# Patient Record
Sex: Female | Born: 1975 | Race: Black or African American | Hispanic: No | Smoking: Never smoker
Health system: Southern US, Community
[De-identification: ages and names within clinical notes are randomized; demographics above are authoritative.]

## PROBLEM LIST (undated history)

## (undated) DIAGNOSIS — G932 Benign intracranial hypertension: Secondary | ICD-10-CM

## (undated) HISTORY — PX: ABDOMINAL HYSTERECTOMY: SHX81

## (undated) HISTORY — PX: TUBAL LIGATION: SHX77

---

## 2008-08-17 ENCOUNTER — Emergency Department (HOSPITAL_COMMUNITY): Admission: EM | Admit: 2008-08-17 | Discharge: 2008-08-18 | Payer: Self-pay | Admitting: Emergency Medicine

## 2008-08-21 ENCOUNTER — Ambulatory Visit: Payer: Self-pay | Admitting: Internal Medicine

## 2008-08-21 DIAGNOSIS — R079 Chest pain, unspecified: Secondary | ICD-10-CM

## 2008-08-21 DIAGNOSIS — K219 Gastro-esophageal reflux disease without esophagitis: Secondary | ICD-10-CM | POA: Insufficient documentation

## 2008-10-07 ENCOUNTER — Ambulatory Visit: Payer: Self-pay | Admitting: Internal Medicine

## 2008-10-08 ENCOUNTER — Ambulatory Visit: Payer: Self-pay | Admitting: Internal Medicine

## 2009-01-20 ENCOUNTER — Emergency Department (HOSPITAL_COMMUNITY): Admission: EM | Admit: 2009-01-20 | Discharge: 2009-01-21 | Payer: Self-pay | Admitting: Family Medicine

## 2009-03-25 ENCOUNTER — Emergency Department (HOSPITAL_COMMUNITY): Admission: EM | Admit: 2009-03-25 | Discharge: 2009-03-25 | Payer: Self-pay | Admitting: Family Medicine

## 2009-07-04 ENCOUNTER — Emergency Department (HOSPITAL_COMMUNITY): Admission: EM | Admit: 2009-07-04 | Discharge: 2009-07-04 | Payer: Self-pay | Admitting: Emergency Medicine

## 2009-11-27 ENCOUNTER — Emergency Department (HOSPITAL_COMMUNITY): Admission: EM | Admit: 2009-11-27 | Discharge: 2009-11-27 | Payer: Self-pay | Admitting: Emergency Medicine

## 2010-03-08 ENCOUNTER — Emergency Department (HOSPITAL_COMMUNITY): Admission: EM | Admit: 2010-03-08 | Discharge: 2010-03-08 | Payer: Self-pay | Admitting: Emergency Medicine

## 2010-04-14 ENCOUNTER — Emergency Department (HOSPITAL_COMMUNITY): Admission: EM | Admit: 2010-04-14 | Discharge: 2010-04-14 | Payer: Self-pay | Admitting: Emergency Medicine

## 2010-07-10 ENCOUNTER — Emergency Department (HOSPITAL_COMMUNITY): Admission: EM | Admit: 2010-07-10 | Discharge: 2010-07-10 | Payer: Self-pay | Admitting: Emergency Medicine

## 2010-09-03 ENCOUNTER — Other Ambulatory Visit: Admission: RE | Admit: 2010-09-03 | Discharge: 2010-09-03 | Payer: Self-pay | Admitting: Family Medicine

## 2011-03-06 LAB — URINALYSIS, ROUTINE W REFLEX MICROSCOPIC
Glucose, UA: NEGATIVE mg/dL
Hgb urine dipstick: NEGATIVE
Ketones, ur: NEGATIVE mg/dL
Leukocytes, UA: NEGATIVE
pH: 6.5 (ref 5.0–8.0)

## 2011-03-06 LAB — URINE MICROSCOPIC-ADD ON

## 2011-03-23 LAB — POCT URINALYSIS DIP (DEVICE)
Hgb urine dipstick: NEGATIVE
Protein, ur: 30 mg/dL — AB
Specific Gravity, Urine: 1.02 (ref 1.005–1.030)
Urobilinogen, UA: 0.2 mg/dL (ref 0.0–1.0)

## 2011-03-23 LAB — POCT PREGNANCY, URINE

## 2011-03-23 LAB — WET PREP, GENITAL: Clue Cells Wet Prep HPF POC: NONE SEEN

## 2011-03-23 LAB — GC/CHLAMYDIA PROBE AMP, GENITAL

## 2011-03-29 LAB — URINALYSIS, ROUTINE W REFLEX MICROSCOPIC
Hgb urine dipstick: NEGATIVE
Protein, ur: NEGATIVE mg/dL
Urobilinogen, UA: 0.2 mg/dL (ref 0.0–1.0)

## 2011-03-29 LAB — POCT I-STAT, CHEM 8
Calcium, Ion: 1.18 mmol/L (ref 1.12–1.32)
Creatinine, Ser: 0.8 mg/dL (ref 0.4–1.2)
Hemoglobin: 13.3 g/dL (ref 12.0–15.0)
Sodium: 142 mEq/L (ref 135–145)
TCO2: 26 mmol/L (ref 0–100)

## 2011-03-29 LAB — HEPATIC FUNCTION PANEL
ALT: 22 U/L (ref 0–35)
Albumin: 3.6 g/dL (ref 3.5–5.2)
Alkaline Phosphatase: 61 U/L (ref 39–117)
Total Protein: 7.4 g/dL (ref 6.0–8.3)

## 2011-03-29 LAB — DIFFERENTIAL
Lymphocytes Relative: 42 % (ref 12–46)
Lymphs Abs: 3.5 10*3/uL (ref 0.7–4.0)
Monocytes Relative: 7 % (ref 3–12)
Neutro Abs: 3.9 10*3/uL (ref 1.7–7.7)
Neutrophils Relative %: 48 % (ref 43–77)

## 2011-03-29 LAB — CBC
RBC: 3.96 MIL/uL (ref 3.87–5.11)
WBC: 8.2 10*3/uL (ref 4.0–10.5)

## 2011-03-29 LAB — ELECTROLYTE PANEL
CO2: 28 mEq/L (ref 19–32)
Chloride: 108 mEq/L (ref 96–112)

## 2011-09-14 LAB — POCT I-STAT, CHEM 8
BUN: 8
Calcium, Ion: 1.1 — ABNORMAL LOW
Chloride: 107
Creatinine, Ser: 1.1
Glucose, Bld: 102 — ABNORMAL HIGH
HCT: 38
Hemoglobin: 12.9
Potassium: 3.9
Sodium: 138
TCO2: 21

## 2011-09-14 LAB — CBC
HCT: 37.4
Hemoglobin: 12.6
MCHC: 33.8
MCV: 90.3
Platelets: 275
RBC: 4.14
RDW: 13.3
WBC: 8.2

## 2011-09-14 LAB — DIFFERENTIAL
Basophils Absolute: 0
Basophils Relative: 0
Eosinophils Absolute: 0.3
Eosinophils Relative: 3
Lymphocytes Relative: 50 — ABNORMAL HIGH
Lymphs Abs: 4
Monocytes Absolute: 0.6
Monocytes Relative: 8
Neutro Abs: 3.2
Neutrophils Relative %: 39 — ABNORMAL LOW

## 2011-09-14 LAB — POCT CARDIAC MARKERS
CKMB, poc: <1 — ABNORMAL LOW
Myoglobin, poc: 53.8
Troponin i, poc: <0.05

## 2011-09-14 LAB — D-DIMER, QUANTITATIVE: D-Dimer, Quant: 0.95 — ABNORMAL HIGH

## 2011-11-30 ENCOUNTER — Ambulatory Visit: Payer: Self-pay

## 2012-03-27 ENCOUNTER — Inpatient Hospital Stay (HOSPITAL_COMMUNITY): Payer: 59

## 2012-03-27 ENCOUNTER — Inpatient Hospital Stay (HOSPITAL_COMMUNITY)
Admission: AD | Admit: 2012-03-27 | Discharge: 2012-03-28 | Disposition: A | Discharge status: Home / Self Care | Payer: 59 | Source: Ambulatory Visit | Attending: Obstetrics and Gynecology | Admitting: Obstetrics and Gynecology

## 2012-03-27 ENCOUNTER — Encounter (HOSPITAL_COMMUNITY): Payer: Self-pay | Admitting: *Deleted

## 2012-03-27 DIAGNOSIS — R109 Unspecified abdominal pain: Secondary | ICD-10-CM

## 2012-03-27 DIAGNOSIS — N83202 Unspecified ovarian cyst, left side: Secondary | ICD-10-CM

## 2012-03-27 DIAGNOSIS — N83209 Unspecified ovarian cyst, unspecified side: Secondary | ICD-10-CM

## 2012-03-27 LAB — COMPREHENSIVE METABOLIC PANEL
AST: 13 U/L (ref 0–37)
Albumin: 3.7 g/dL (ref 3.5–5.2)
Alkaline Phosphatase: 54 U/L (ref 39–117)
Chloride: 103 mEq/L (ref 96–112)
Potassium: 3.7 mEq/L (ref 3.5–5.1)
Total Bilirubin: 0.1 mg/dL — ABNORMAL LOW (ref 0.3–1.2)

## 2012-03-27 LAB — CBC
Platelets: 283 10*3/uL (ref 150–400)
RDW: 13.6 % (ref 11.5–15.5)
WBC: 8.9 10*3/uL (ref 4.0–10.5)

## 2012-03-27 LAB — URINE MICROSCOPIC-ADD ON

## 2012-03-27 LAB — URINALYSIS, ROUTINE W REFLEX MICROSCOPIC
Leukocytes, UA: NEGATIVE
Nitrite: NEGATIVE
Specific Gravity, Urine: 1.02 (ref 1.005–1.030)
pH: 6.5 (ref 5.0–8.0)

## 2012-03-27 MED ORDER — LACTATED RINGERS IV BOLUS (SEPSIS): 250.0000 mL | Freq: Once | INTRAVENOUS | Status: DC

## 2012-03-27 MED ORDER — SODIUM CHLORIDE 0.9 % IV SOLN: 4.0000 mg | Freq: Once | INTRAVENOUS | Status: DC

## 2012-03-27 MED ORDER — LACTATED RINGERS IV SOLN
Freq: Once | INTRAVENOUS | Status: AC
  Administered 2012-03-27: via INTRAVENOUS

## 2012-03-27 MED ORDER — KETOROLAC TROMETHAMINE 60 MG/2ML IM SOLN: 60.0000 mg | Freq: Once | INTRAMUSCULAR | Status: DC

## 2012-03-27 MED ORDER — HYDROMORPHONE HCL PF 1 MG/ML IJ SOLN
1.0000 mg | Freq: Once | INTRAMUSCULAR | Status: AC
  Administered 2012-03-27: 1 mg via INTRAVENOUS
  Filled 2012-03-27: qty 1

## 2012-03-27 NOTE — MAU Note (Signed)
Iv attempt x 3 without success, CRNA called to assess.

## 2012-03-27 NOTE — MAU Note (Signed)
Pt presents with complaint of onset of severe lower abd pain 1 hour ago. States pain goes all the way across the lower abd and lower back. Has sharp pain in vaginal area. History of partial hyst in 2008 for adenomyosis. Reports nausea with pain.

## 2012-03-27 NOTE — MAU Note (Signed)
[place],NP at bedside, CRNA at bedside to attempt IV

## 2012-03-27 NOTE — MAU Provider Note (Signed)
History     CSN: [id number]  Arrival date and time: 03/27/12 2051   First Provider Initiated Contact with Patient 03/27/12 2157      Chief Complaint  Patient presents with  . Abdominal Pain   HPI Annette Garrett 36 y.o. Comes to MAU with severe abdominal pain which started at 8 pm tonight.  Client lying on her side and on initial exam, can barely talk and cannot move to her back for evaluation.  History of partial hysterectomy in 2008.  OB History    Grav Para Term Preterm Abortions TAB SAB Ect Mult Living   5 3   2  2   3       Past Medical History  Diagnosis Date  . Asthma     Past Surgical History  Procedure Date  . Abdominal hysterectomy   . Tubal ligation     Family History  Problem Relation Age of Onset  . Hypertension Mother   . Diabetes Father   . Diabetes Maternal Grandmother   . Hypertension Maternal Grandmother     History  Substance Use Topics  . Smoking status: Never Smoker   . Smokeless tobacco: Not on file  . Alcohol Use: No    Allergies:  Allergies  Allergen Reactions  . Oxycodone-Acetaminophen Itching    Prescriptions prior to admission  Medication Sig Dispense Refill  . albuterol (PROVENTIL) (2.5 MG/3ML) 0.083% nebulizer solution Take 2.5 mg by nebulization every 6 (six) hours as needed. For shortness of breath        Review of Systems  Gastrointestinal: Positive for nausea and abdominal pain. Negative for vomiting and diarrhea.  Genitourinary: Negative for dysuria.   Physical Exam   Blood pressure 109/88, pulse 85, temperature 97.4 F (36.3 C), temperature source Oral, resp. rate 20.  Physical Exam  Nursing note and vitals reviewed. Constitutional: She is oriented to person, place, and time. She appears well-developed and well-nourished.       Obese  HENT:  Head: Normocephalic.  Eyes: EOM are normal.  Neck: Neck supple.  GI: Soft. There is tenderness. There is no rebound.       Mild guarding, pain all across lower  abdomen just below the umbilicus Bowel sounds hyperactive in all quadrants.  Musculoskeletal: Normal range of motion.  Neurological: She is alert and oriented to person, place, and time.  Skin: Skin is warm and dry.  Psychiatric: She has a normal mood and affect.    MAU Course  Procedures Blood drawn in left antecubital space.  Client reports "lump" in arm near draw site.  No bruising noted.  No bleeding from site.  Area 1.5 cm in diameter palpated.  Mild tenderness.  Reassured client.  Likely is some blood under the skin and may notice bruising later.  Ice pack applied to site.  Client is difficult IV stick.  Planned to give IVF and Dilaudid, however unable to start IVF.  Anesthesia is delayed in coming to stick client.  Client currently is talking and moving much better in bed, however she reports pain at 10/10.  Advised to take Toradol 60 mg IM and have ultrasound done.  Will see how well ultrasound is tolerated and give narcotic if needed. 2318  Client sitting and rocking in bed.  CRNA here to start IVF.  Got IVF started and had Dilaudid 1 mg and Zofran 4 mg IVpush.  MDM Clinical Data: Severe abdominal pain.  ABDOMINAL ULTRASOUND COMPLETE  Comparison: None.  Findings:  Technically good cold  study due to body habitus.  Gallbladder: No gallstones, gallbladder wall thickening, or  pericholecystic fluid. Sonographic Murphy's sign is negative.  Common Bile Duct: Within normal limits in caliber.  Liver: No focal mass lesion identified. Within normal limits in  parenchymal echogenicity.  IVC: Appears normal.  Pancreas: No abnormality identified.  Spleen: Within normal limits in size and echotexture.  Right kidney: Normal in size and parenchymal echogenicity. No  evidence of mass or hydronephrosis.  Left kidney: Normal in size and parenchymal echogenicity. No  evidence of mass or hydronephrosis.  Abdominal Aorta: No aneurysm identified.  IMPRESSION:  Negative abdominal  ultrasound  Clinical Data: Severe abdominal pain  TRANSABDOMINAL AND TRANSVAGINAL ULTRASOUND OF PELVIS  Technique: Both transabdominal and transvaginal ultrasound  examinations of the pelvis were performed. Transabdominal technique  was performed for global imaging of the pelvis including uterus,  ovaries, adnexal regions, and pelvic cul-de-sac.  Comparison: Abdominal ultrasound 03/28/2012  It was necessary to proceed with endovaginal exam following the  transabdominal exam to visualize the ovaries and adnexa.  Findings:  Uterus: Surgically absent.  Endometrium: Surgically absent.  Right ovary: Measures 2.5 x 1.5 x 1.5 cm and has normal  appearances. No right adnexal mass is seen.  Left ovary: Measures 4.5 x 3.8-3.9 cm and contains a mildly complex  cyst with some internal fine linear echogenicities. This cyst  measures 3.7 x 3.0 x 3.3 cm.  Other findings: A trace amount of free pelvic fluid is seen.  In the midline pelvis is an oblong heterogeneous predominately  echogenic area, appears to conform to the peritoneal space, with  surrounding fluid. This measures approximately 6 x 1.5 x 5.0 cm.  Question if this could reflect some complex fluid within the  pelvis.  IMPRESSION:  1. Complex left ovarian cyst could reflect a hemorrhagic cyst or  endometrioma. Follow-up pelvic ultrasound in 6 to 8 weeks week is  suggested.  2. Oblong echogenic area within the midline pelvis with some  surrounding simple fluid appears to conform to the peritoneal  space. Question if there could be some complex fluid or blood in  the pelvis. This finding is nonspecific. In the setting of acute  pelvic pain, the possibility of a ruptured cyst is considered.  Depending on the patient's symptoms, further evaluation with CT  abdomen and pelvis with contrast may be useful.  Results for orders placed during the hospital encounter of 03/27/12 (from the past 24 hour(s))  URINALYSIS, ROUTINE W REFLEX MICROSCOPIC      Status: Abnormal   Collection Time   03/27/12  9:15 PM      Component Value Range   Color, Urine YELLOW  YELLOW    APPearance CLEAR  CLEAR    Specific Gravity, Urine 1.020  1.005 - 1.030    pH 6.5  5.0 - 8.0    Glucose, UA NEGATIVE  NEGATIVE (mg/dL)   Hgb urine dipstick TRACE (*) NEGATIVE    Bilirubin Urine NEGATIVE  NEGATIVE    Ketones, ur NEGATIVE  NEGATIVE (mg/dL)   Protein, ur NEGATIVE  NEGATIVE (mg/dL)   Urobilinogen, UA 0.2  0.0 - 1.0 (mg/dL)   Nitrite NEGATIVE  NEGATIVE    Leukocytes, UA NEGATIVE  NEGATIVE   URINE MICROSCOPIC-ADD ON     Status: Normal   Collection Time   03/27/12  9:15 PM      Component Value Range   Squamous Epithelial / LPF RARE  RARE    RBC / HPF 0-2  <3 (RBC/hpf)  CBC  Status: Abnormal   Collection Time   03/27/12 10:00 PM      Component Value Range   WBC 8.9  4.0 - 10.5 (K/uL)   RBC 3.87  3.87 - 5.11 (MIL/uL)   Hemoglobin 11.5 (*) 12.0 - 15.0 (g/dL)   HCT 04.5 (*) 40.9 - 46.0 (%)   MCV 90.4  78.0 - 100.0 (fL)   MCH 29.7  26.0 - 34.0 (pg)   MCHC 32.9  30.0 - 36.0 (g/dL)   RDW 81.1  91.4 - 78.2 (%)   Platelets 283  150 - 400 (K/uL)  COMPREHENSIVE METABOLIC PANEL     Status: Abnormal   Collection Time   03/27/12 10:00 PM      Component Value Range   Sodium 136  135 - 145 (mEq/L)   Potassium 3.7  3.5 - 5.1 (mEq/L)   Chloride 103  96 - 112 (mEq/L)   CO2 24  19 - 32 (mEq/L)   Glucose, Bld 96  70 - 99 (mg/dL)   BUN 13  6 - 23 (mg/dL)   Creatinine, Ser 9.56  0.50 - 1.10 (mg/dL)   Calcium 9.1  8.4 - 21.3 (mg/dL)   Total Protein 7.3  6.0 - 8.3 (g/dL)   Albumin 3.7  3.5 - 5.2 (g/dL)   AST 13  0 - 37 (U/L)   ALT 12  0 - 35 (U/L)   Alkaline Phosphatase 54  39 - 117 (U/L)   Total Bilirubin 0.1 (*) 0.3 - 1.2 (mg/dL)   GFR calc non Af Amer >90  >90 (mL/min)   GFR calc Af Amer >90  >90 (mL/min)   Assessment and Plan  Abdominal pain Ovarian cyst on left Possible fluid in pelvis suggesting possible ruptured cyst  Plan Client is improved but  not completely pain free. Will discharge home with medications for management of pain  Message sent to GYN clinic to schedule an appointment in 6 weeks for follow up   [facility] 03/27/2012, 10:24 PM

## 2012-03-28 MED ORDER — IBUPROFEN 600 MG PO TABS: 600.0000 mg | ORAL_TABLET | Freq: Four times a day (QID) | ORAL | Status: AC | PRN

## 2012-03-28 MED ORDER — ONDANSETRON HCL 4 MG/2ML IJ SOLN
4.0000 mg | Freq: Once | INTRAMUSCULAR | Status: AC
  Administered 2012-03-28: 4 mg via INTRAVENOUS
  Filled 2012-03-28: qty 2

## 2012-03-28 MED ORDER — HYDROCODONE-ACETAMINOPHEN 5-325 MG PO TABS: 1.0000 | ORAL_TABLET | ORAL | Status: AC | PRN

## 2012-03-28 NOTE — Discharge Instructions (Signed)
Expect the clinic to call to schedule an appointment. Get your prescriptions filled and use as directed to control your pain.  Do not expect the pain to be completely relieved, but use the medication to help you lessen the pain.

## 2012-04-02 NOTE — MAU Provider Note (Signed)
Agree with above note.  Dyer Klug 04/02/2012 9:35 AM   

## 2012-04-05 ENCOUNTER — Other Ambulatory Visit: Payer: Self-pay | Admitting: Family Medicine

## 2012-04-05 ENCOUNTER — Other Ambulatory Visit (HOSPITAL_COMMUNITY)
Admission: RE | Admit: 2012-04-05 | Discharge: 2012-04-05 | Disposition: A | Discharge status: Home / Self Care | Payer: 59 | Source: Ambulatory Visit | Attending: Family Medicine | Admitting: Family Medicine

## 2012-04-05 DIAGNOSIS — Z01419 Encounter for gynecological examination (general) (routine) without abnormal findings: Secondary | ICD-10-CM | POA: Insufficient documentation

## 2012-04-05 DIAGNOSIS — Z1159 Encounter for screening for other viral diseases: Secondary | ICD-10-CM | POA: Insufficient documentation

## 2012-05-02 ENCOUNTER — Encounter: Payer: 59 | Admitting: Obstetrics & Gynecology

## 2012-08-24 ENCOUNTER — Other Ambulatory Visit: Payer: Self-pay | Admitting: Family Medicine

## 2012-08-24 DIAGNOSIS — Z1231 Encounter for screening mammogram for malignant neoplasm of breast: Secondary | ICD-10-CM

## 2012-08-31 ENCOUNTER — Ambulatory Visit
Admission: RE | Admit: 2012-08-31 | Discharge: 2012-08-31 | Disposition: A | Discharge status: Home / Self Care | Payer: 59 | Source: Ambulatory Visit | Attending: Family Medicine | Admitting: Family Medicine

## 2012-08-31 DIAGNOSIS — Z1231 Encounter for screening mammogram for malignant neoplasm of breast: Secondary | ICD-10-CM

## 2012-12-22 ENCOUNTER — Emergency Department (HOSPITAL_COMMUNITY)
Admission: EM | Admit: 2012-12-22 | Discharge: 2012-12-22 | Disposition: A | Discharge status: Home / Self Care | Payer: Managed Care, Other (non HMO) | Attending: Emergency Medicine | Admitting: Emergency Medicine

## 2012-12-22 ENCOUNTER — Emergency Department (HOSPITAL_COMMUNITY): Payer: Managed Care, Other (non HMO)

## 2012-12-22 ENCOUNTER — Encounter (HOSPITAL_COMMUNITY): Payer: Self-pay | Admitting: Nurse Practitioner

## 2012-12-22 DIAGNOSIS — R51 Headache: Secondary | ICD-10-CM

## 2012-12-22 DIAGNOSIS — K219 Gastro-esophageal reflux disease without esophagitis: Secondary | ICD-10-CM | POA: Insufficient documentation

## 2012-12-22 DIAGNOSIS — Z79899 Other long term (current) drug therapy: Secondary | ICD-10-CM | POA: Insufficient documentation

## 2012-12-22 DIAGNOSIS — R079 Chest pain, unspecified: Secondary | ICD-10-CM | POA: Insufficient documentation

## 2012-12-22 DIAGNOSIS — IMO0002 Reserved for concepts with insufficient information to code with codable children: Secondary | ICD-10-CM | POA: Insufficient documentation

## 2012-12-22 DIAGNOSIS — Z90710 Acquired absence of both cervix and uterus: Secondary | ICD-10-CM | POA: Insufficient documentation

## 2012-12-22 DIAGNOSIS — J45909 Unspecified asthma, uncomplicated: Secondary | ICD-10-CM | POA: Insufficient documentation

## 2012-12-22 LAB — CBC
Hemoglobin: 12.1 g/dL (ref 12.0–15.0)
MCH: 30.3 pg (ref 26.0–34.0)
MCHC: 33.3 g/dL (ref 30.0–36.0)
MCV: 91 fL (ref 78.0–100.0)

## 2012-12-22 LAB — BASIC METABOLIC PANEL
BUN: 9 mg/dL (ref 6–23)
CO2: 26 mEq/L (ref 19–32)
Calcium: 8.9 mg/dL (ref 8.4–10.5)
GFR calc non Af Amer: 88 mL/min — ABNORMAL LOW (ref >90)
Glucose, Bld: 111 mg/dL — ABNORMAL HIGH (ref 70–99)
Potassium: 3.4 mEq/L — ABNORMAL LOW (ref 3.5–5.1)

## 2012-12-22 LAB — POCT I-STAT TROPONIN I

## 2012-12-22 MED ORDER — KETOROLAC TROMETHAMINE 30 MG/ML IJ SOLN
30.0000 mg | Freq: Once | INTRAMUSCULAR | Status: AC
  Administered 2012-12-22: 30 mg via INTRAVENOUS
  Filled 2012-12-22: qty 1

## 2012-12-22 MED ORDER — DIPHENHYDRAMINE HCL 50 MG/ML IJ SOLN
25.0000 mg | Freq: Once | INTRAMUSCULAR | Status: AC
  Administered 2012-12-22: 25 mg via INTRAVENOUS
  Filled 2012-12-22: qty 1

## 2012-12-22 MED ORDER — OMEPRAZOLE 20 MG PO CPDR: 20.0000 mg | DELAYED_RELEASE_CAPSULE | Freq: Every day | ORAL | Status: DC

## 2012-12-22 MED ORDER — METOCLOPRAMIDE HCL 5 MG/ML IJ SOLN
20.0000 mg | Freq: Once | INTRAMUSCULAR | Status: AC
  Administered 2012-12-22: 20 mg via INTRAVENOUS
  Filled 2012-12-22: qty 4

## 2012-12-22 MED ORDER — TRAMADOL HCL 50 MG PO TABS: 50.0000 mg | ORAL_TABLET | Freq: Four times a day (QID) | ORAL | Status: DC | PRN

## 2012-12-22 MED ORDER — ACETAMINOPHEN 325 MG PO TABS
650.0000 mg | ORAL_TABLET | Freq: Once | ORAL | Status: AC
  Administered 2012-12-22: 650 mg via ORAL
  Filled 2012-12-22: qty 2

## 2012-12-22 NOTE — ED Notes (Signed)
Patient transported from X-ray 

## 2012-12-22 NOTE — ED Notes (Signed)
Family at bedside. 

## 2012-12-22 NOTE — ED Provider Notes (Signed)
History     CSN: [id number]  Arrival date & time 12/22/12  1324   First MD Initiated Contact with Patient 12/22/12 1446      Chief Complaint  Patient presents with  . Headache  . Chest Pain    (Consider location/radiation/quality/duration/timing/severity/associated sxs/prior treatment) HPI Patient presents with concerns of chest pain and headache. The headache is new. The patient notes that she has had chest pain for months at least. The chest pain is left-sided, inconsistent, spontaneous onset/offset, forceful palpitations, with radiation to the left arm.  It is not exertional or pleuritic. This has been occurring within the past week as the patient has a new headache.  She has a history of only infrequent headaches prior to the past week.  Approximately one week ago she gradually developed a headache.  Since onset the headache has been worsening, more persistent, diffuse, throbbing.  There is no associated confusion, disorientation, for further, vomiting, diarrhea, ataxia, weakness, dysphasia, dysarthria. She has not taken medication for relief thus far.   Past Medical History  Diagnosis Date  . Asthma     Past Surgical History  Procedure Date  . Abdominal hysterectomy   . Tubal ligation     Family History  Problem Relation Age of Onset  . Hypertension Mother   . Diabetes Father   . Diabetes Maternal Grandmother   . Hypertension Maternal Grandmother     History  Substance Use Topics  . Smoking status: Never Smoker   . Smokeless tobacco: Not on file  . Alcohol Use: Yes    OB History    Grav Para Term Preterm Abortions TAB SAB Ect Mult Living   5 3   2  2   3       Review of Systems  Constitutional:       Per HPI, otherwise negative  HENT:       Per HPI, otherwise negative  Eyes: Negative.   Respiratory:       Per HPI, otherwise negative  Cardiovascular:       Per HPI, otherwise negative  Gastrointestinal: Negative for vomiting.  Genitourinary:  Negative.   Musculoskeletal:       Per HPI, otherwise negative  Skin: Negative.   Neurological: Positive for headaches. Negative for syncope, weakness and numbness.    Allergies  Oxycodone-acetaminophen  Home Medications   Current Outpatient Rx  Name  Route  Sig  Dispense  Refill  . ALBUTEROL SULFATE HFA 108 (90 BASE) MCG/ACT IN AERS   Inhalation   Inhale 2 puffs into the lungs every 6 (six) hours as needed. For wheezing and shortness of breath.         . BECLOMETHASONE DIPROPIONATE 40 MCG/ACT IN AERS   Inhalation   Inhale 2 puffs into the lungs 2 (two) times daily.         Marland Kitchen CETIRIZINE HCL 10 MG PO TABS   Oral   Take 10 mg by mouth daily.         Marland Kitchen FEXOFENADINE HCL 180 MG PO TABS   Oral   Take 180 mg by mouth daily.         Marland Kitchen HYDRALAZINE HCL 25 MG PO TABS   Oral   Take 25 mg by mouth 3 (three) times daily.         . IBUPROFEN 800 MG PO TABS   Oral   Take 800 mg by mouth every 8 (eight) hours as needed. For pain.         Marland Kitchen  ALBUTEROL SULFATE (2.5 MG/3ML) 0.083% IN NEBU   Nebulization   Take 2.5 mg by nebulization every 6 (six) hours as needed. For shortness of breath           BP 108/48  Pulse 84  Temp 99.7 F (37.6 C) (Oral)  Resp 16  SpO2 99%  Physical Exam  Nursing note and vitals reviewed. Constitutional: She is oriented to person, place, and time. She appears well-developed and well-nourished. No distress.  HENT:  Head: Normocephalic and atraumatic.  Eyes: Conjunctivae normal and EOM are normal. Pupils are equal, round, and reactive to light.  Neck: Normal range of motion. No rigidity. No edema and normal range of motion present.  Cardiovascular: Normal rate and regular rhythm.   Pulmonary/Chest: Effort normal and breath sounds normal. No stridor. No respiratory distress.  Abdominal: She exhibits no distension.  Musculoskeletal: She exhibits no edema.  Neurological: She is alert and oriented to person, place, and time. She displays no  atrophy and no tremor. No cranial nerve deficit or sensory deficit. She exhibits normal muscle tone. She displays no seizure activity. Coordination and gait normal.  Skin: Skin is warm and dry.  Psychiatric: She has a normal mood and affect.    ED Course  Procedures (including critical care time)   Labs Reviewed  CBC  BASIC METABOLIC PANEL   Dg Chest 2 View  12/22/2012  *RADIOLOGY REPORT*  Clinical Data: Headache, chest pain  CHEST - 2 VIEW  Comparison:  01/20/2009  Findings:  The heart size and mediastinal contours are within normal limits.  Both lungs are clear.  The visualized skeletal structures are unremarkable.  IMPRESSION: No active cardiopulmonary disease.   Original Report Authenticated By: Judie Petit. Miles Costain, M.D.      No diagnosis found.  O2 - 99%ra, normal   Date: 12/22/2012  Rate: 89  Rhythm: normal sinus rhythm  QRS Axis: normal  Intervals: normal  ST/T Wave abnormalities: normal  Conduction Disutrbances:none  Narrative Interpretation:   Old EKG Reviewed: none available BORDERLINE - ANTERIOR Q waves  5:28 PM Patient resting, seemingly comfortably.  No new complaints.  HA improving.   MDM  This young female presents with concerns of headache, chest pain.  On exam the patient is in no distress, and her description of pain suggests that the pain has been present in her chest for significantly longer than 1 week.  On exam the patient is neurovascularly intact throughout with unremarkable vital signs, awake, alert, appropriately interactive.  There is low suspicion for meningitis, subarachnoid hemorrhage.  With multiple negative troponins, and the absence of distress, stable vital signs, and few risk factors the patient is appropriate for discharge with outpatient followup.     Gerhard Munch, MD 12/24/12 [phone]

## 2012-12-22 NOTE — ED Notes (Signed)
Patient transported to X-ray 

## 2012-12-22 NOTE — ED Notes (Signed)
Pt ambulated to and from RR with steady gait

## 2012-12-22 NOTE — ED Notes (Signed)
C/o headaches for past 5 days and chest pain for past week. Pt reports pain is intermittent and sometimes feels palpitations as well. Vague descriptions. A&Ox4, resp e/u

## 2013-06-27 ENCOUNTER — Emergency Department (HOSPITAL_COMMUNITY)
Admission: EM | Admit: 2013-06-27 | Discharge: 2013-06-28 | Disposition: A | Discharge status: Home / Self Care | Payer: Managed Care, Other (non HMO) | Attending: Emergency Medicine | Admitting: Emergency Medicine

## 2013-06-27 ENCOUNTER — Other Ambulatory Visit: Payer: Self-pay

## 2013-06-27 ENCOUNTER — Emergency Department (HOSPITAL_COMMUNITY): Payer: Managed Care, Other (non HMO)

## 2013-06-27 ENCOUNTER — Encounter (HOSPITAL_COMMUNITY): Payer: Self-pay | Admitting: Adult Health

## 2013-06-27 DIAGNOSIS — J45909 Unspecified asthma, uncomplicated: Secondary | ICD-10-CM | POA: Insufficient documentation

## 2013-06-27 DIAGNOSIS — Z79899 Other long term (current) drug therapy: Secondary | ICD-10-CM | POA: Insufficient documentation

## 2013-06-27 DIAGNOSIS — R079 Chest pain, unspecified: Secondary | ICD-10-CM

## 2013-06-27 DIAGNOSIS — Z720 Tobacco use: Secondary | ICD-10-CM

## 2013-06-27 DIAGNOSIS — Z885 Allergy status to narcotic agent status: Secondary | ICD-10-CM | POA: Insufficient documentation

## 2013-06-27 DIAGNOSIS — R0602 Shortness of breath: Secondary | ICD-10-CM | POA: Insufficient documentation

## 2013-06-27 DIAGNOSIS — F17200 Nicotine dependence, unspecified, uncomplicated: Secondary | ICD-10-CM | POA: Insufficient documentation

## 2013-06-27 LAB — CBC
HCT: 36.2 % (ref 36.0–46.0)
Hemoglobin: 12.3 g/dL (ref 12.0–15.0)
WBC: 6.7 10*3/uL (ref 4.0–10.5)

## 2013-06-27 LAB — POCT I-STAT TROPONIN I: Troponin i, poc: 0.01 ng/mL (ref 0.00–0.08)

## 2013-06-27 LAB — BASIC METABOLIC PANEL
BUN: 9 mg/dL (ref 6–23)
Chloride: 103 mEq/L (ref 96–112)
Glucose, Bld: 84 mg/dL (ref 70–99)
Potassium: 3.7 mEq/L (ref 3.5–5.1)

## 2013-06-27 NOTE — ED Notes (Signed)
Presents with onset of chest heaviness on the left side of chest with radiation to right sdie of chest and right arm that began at 18:20 today associated with SOB, lightheadedness and headache. Nothing makes pain better and nothing makes pain worse.

## 2013-06-27 NOTE — ED Provider Notes (Signed)
History    CSN: [id number] Arrival date & time 06/27/13  4098  First MD Initiated Contact with Patient 06/27/13 2307     Chief Complaint  Patient presents with  . Chest Pain   (Consider location/radiation/quality/duration/timing/severity/associated sxs/prior Treatment) HPI  Patient is a 37 yo woman with a past medical history significant for asthma who presents with complaints of diffuse aching chest pain which began while she was sitting watching television around 5 PM. Her pain has been slowly resolving and she is currently pain-free. The patient initially felt some shortness of breath. But, this has resolved. She denies any wheezing, shortness of breath or fever.  She has not had diaphoresis, nausea, vomiting or abdominal pain. She denies history of similar symptoms. She has no TTP history or risk factors. She is one pack per day smoker. She does not have any family history of early coronary artery disease. She denies cocaine use  When the patient was experiencing discomfort she said that its worst, her symptoms were 5/10 in severity. She noticed that sometimes the discomfort seemed to be present in her right upper arm as well. She said she decided to come to the emergency department because it is better to be safe than sorry. Past Medical History  Diagnosis Date  . Asthma    Past Surgical History  Procedure Laterality Date  . Abdominal hysterectomy    . Tubal ligation     Family History  Problem Relation Age of Onset  . Hypertension Mother   . Diabetes Father   . Diabetes Maternal Grandmother   . Hypertension Maternal Grandmother    History  Substance Use Topics  . Smoking status: Never Smoker   . Smokeless tobacco: Not on file  . Alcohol Use: Yes   OB History   Grav Para Term Preterm Abortions TAB SAB Ect Mult Living   5 3   2  2   3      Review of Systems Gen: no weight loss, fevers, chills, night sweats Eyes: no discharge or drainage, no occular pain or visual  changes Nose: no epistaxis or rhinorrhea Mouth: no dental pain, no sore throat Neck: no neck pain Lungs: As per history of present illness, otherwise negative CV: As per history of present illness, otherwise negative Abd: no abdominal pain, nausea, vomiting GU: no dysuria or gross hematuria MSK: no myalgias or arthralgias Neuro: no headache, no focal neurologic deficits Skin: no rash Psyche: negative.  Allergies  Oxycodone-acetaminophen  Home Medications   Current Outpatient Rx  Name  Route  Sig  Dispense  Refill  . albuterol (PROAIR HFA) 108 (90 BASE) MCG/ACT inhaler   Inhalation   Inhale 2 puffs into the lungs every 6 (six) hours as needed. For wheezing and shortness of breath.         . beclomethasone (QVAR) 40 MCG/ACT inhaler   Inhalation   Inhale 2 puffs into the lungs 2 (two) times daily.         . cetirizine (ZYRTEC) 10 MG tablet   Oral   Take 10 mg by mouth daily.         . fexofenadine (ALLEGRA) 180 MG tablet   Oral   Take 180 mg by mouth daily.          BP 111/53  Pulse 73  Temp(Src) 98.4 F (36.9 C) (Oral)  Resp 14  Ht 5' 2.25" (1.581 m)  Wt 200 lb (90.719 kg)  BMI 36.29 kg/m2  SpO2 100% Physical Exam Gen:  well developed and well nourished appearing, laying on gurney watching television, appears comfortable Head: NCAT Eyes: PERL, EOMI Nose: no epistaixis or rhinorrhea Mouth/throat: mucosa is moist and pink Neck: supple, no stridor Lungs: CTA B, no wheezing, rhonchi or rales cardiovascular-regular rate and rhythm, no murmur, extremities appear well perfused Abd: soft, notender, nondistended Back: no ttp, no cva ttp Skin: no rashese, wnl Neuro: CN ii-xii grossly intact, no focal deficits Ext: no ttp, no edema Psyche; normal affect,  calm and cooperative.   ED Course  Procedures (including critical care time) Dg Chest 2 View  06/27/2013   *RADIOLOGY REPORT*  Clinical Data: Chest pain and SOB  CHEST - 2 VIEW  Comparison: 12/22/2012   Findings: No significant osseous abnormality.  Lungs are clear. No effusion or pneumothorax.  Cardiomediastinal size and contour are within normal limits.  The upper abdomen is unremarkable.  IMPRESSION: No evidence of acute cardiopulmonary disease.   Original Report Authenticated By: Tiburcio Pea   Results for orders placed during the hospital encounter of 06/27/13 (from the past 24 hour(s))  CBC     Status: None   Collection Time    06/27/13  7:31 PM      Result Value Range   WBC 6.7  4.0 - 10.5 K/uL   RBC 4.06  3.87 - 5.11 MIL/uL   Hemoglobin 12.3  12.0 - 15.0 g/dL   HCT 16.1  09.6 - 04.5 %   MCV 89.2  78.0 - 100.0 fL   MCH 30.3  26.0 - 34.0 pg   MCHC 34.0  30.0 - 36.0 g/dL   RDW 40.9  81.1 - 91.4 %   Platelets 304  150 - 400 K/uL  BASIC METABOLIC PANEL     Status: Abnormal   Collection Time    06/27/13  7:31 PM      Result Value Range   Sodium 139  135 - 145 mEq/L   Potassium 3.7  3.5 - 5.1 mEq/L   Chloride 103  96 - 112 mEq/L   CO2 28  19 - 32 mEq/L   Glucose, Bld 84  70 - 99 mg/dL   BUN 9  6 - 23 mg/dL   Creatinine, Ser 7.82  0.50 - 1.10 mg/dL   Calcium 9.1  8.4 - 95.6 mg/dL   GFR calc non Af Amer 87 (*) >90 mL/min   GFR calc Af Amer >90  >90 mL/min  POCT I-STAT TROPONIN I     Status: None   Collection Time    06/27/13  7:41 PM      Result Value Range   Troponin i, poc 0.00  0.00 - 0.08 ng/mL   Comment 3           POCT I-STAT TROPONIN I     Status: None   Collection Time    06/27/13 11:45 PM      Result Value Range   Troponin i, poc 0.01  0.00 - 0.08 ng/mL   Comment 3            EKG: nsr, no acute ischemic changes, normal intervals, normal axis, normal qrs complex  MDM  The patient's ED workup is unremarkable first negative troponin at 7:30 and repeat troponin which is also negative around midnight. The patient has a normal EKG, chest x-ray, normal vital signs. She is pain free without intervention. She has a low TIMI risk score. She is stable for discharge with  plan for close outpatient followup. She is counseled to  return to the emergency department for recurrent symptoms or any urgent health concerns. Plan discussed with the patient.  Brandt Loosen, MD 06/28/13 [phone]

## 2013-06-27 NOTE — ED Notes (Signed)
Pt c/o intermittent central cp with no radiation that started around 6pm. Pt denies any pain at this time but states she has had cp before but not like this.

## 2013-06-27 NOTE — ED Notes (Signed)
Pt states she originally came in due to pain on her right arm. Next patient started having pain in central chest intermittent heaviness and sharp in nature. Pt alert and oriented. No signs of distress noted. Family at bedside. Pt denies pain at this moment. MD at bedside of plan of care.

## 2013-07-10 IMAGING — US US ABDOMEN COMPLETE
1 series · 14 of 25 positions shown · non-contrast
Comparison: None.

CLINICAL DATA: Severe abdominal pain.

ABDOMINAL ULTRASOUND COMPLETE

[Series 1: us abdomen complete · 14 of 76 slices shown]
[im 1/76]
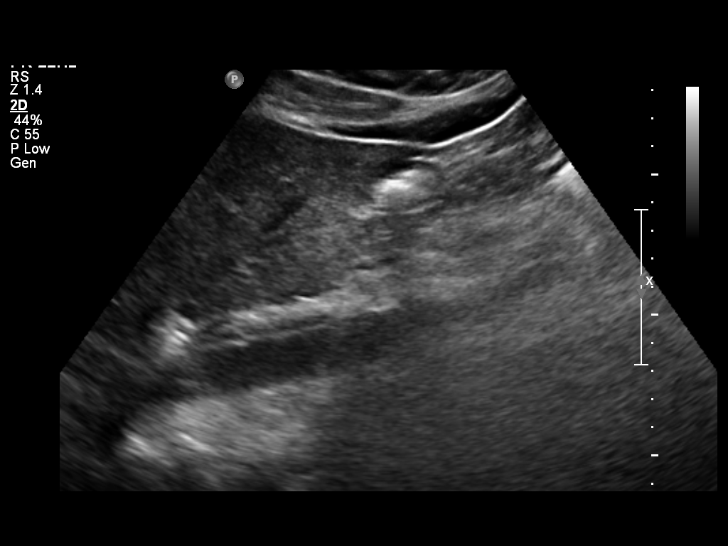
[im 7/76]
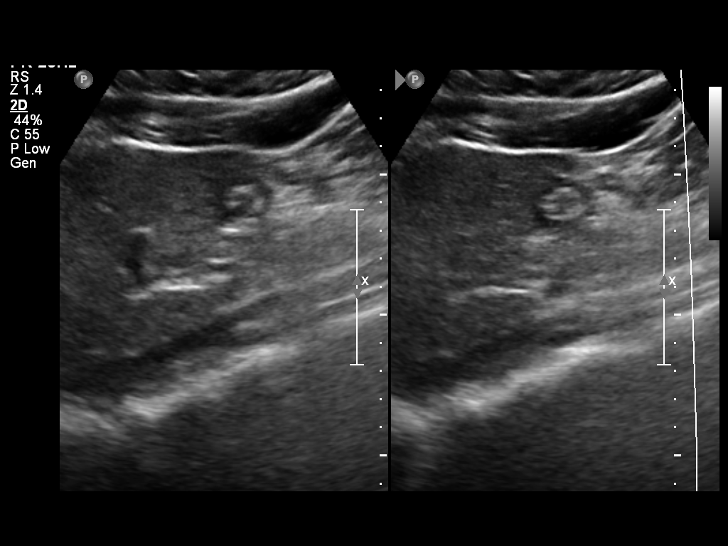
[im 13/76]
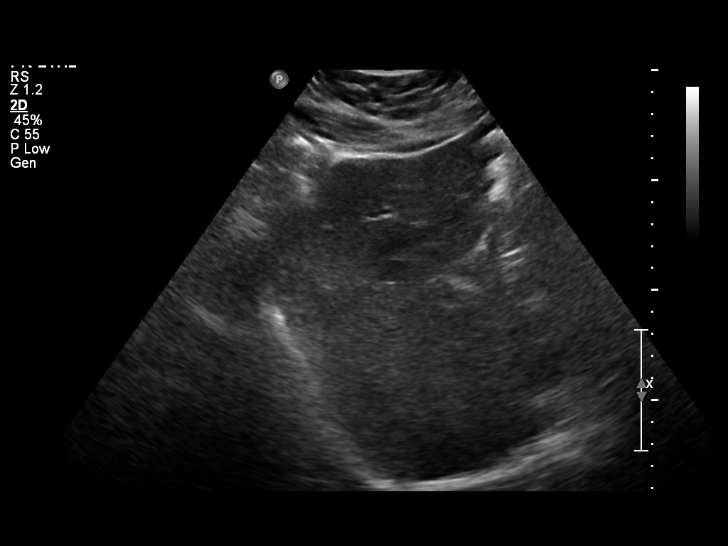
[im 19/76]
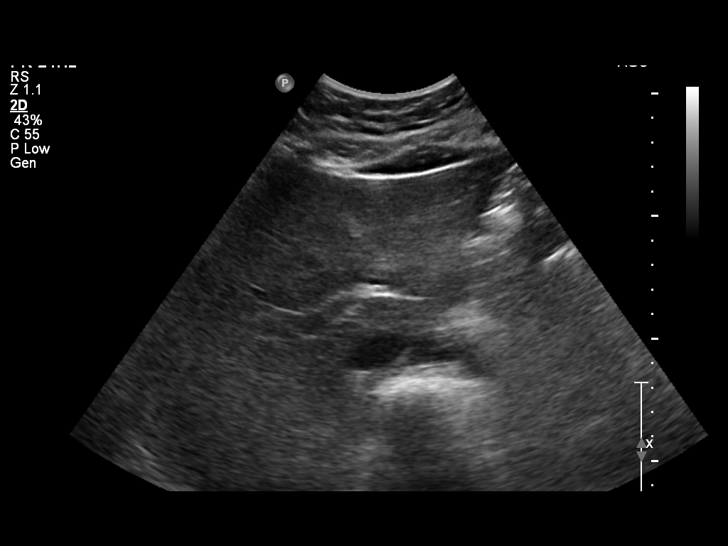
[im 26/76]
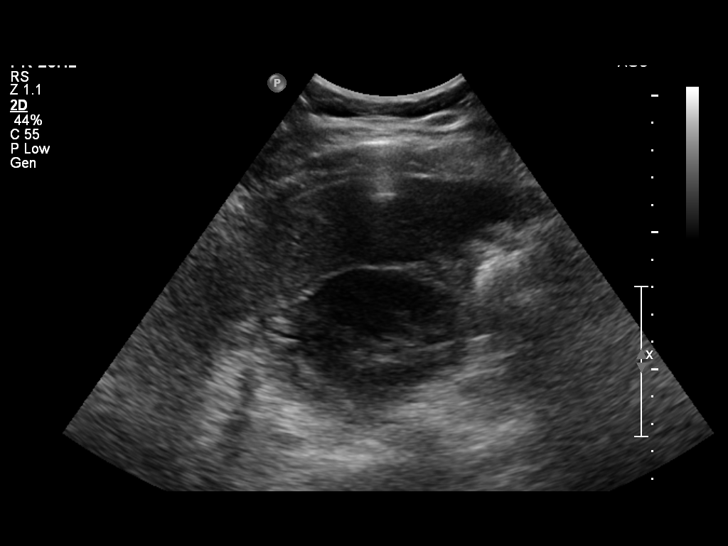
[im 29/76]
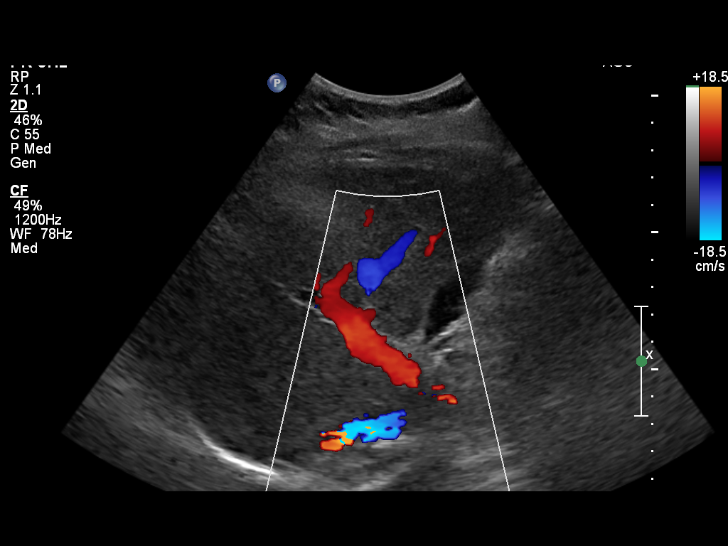
[im 35/76]
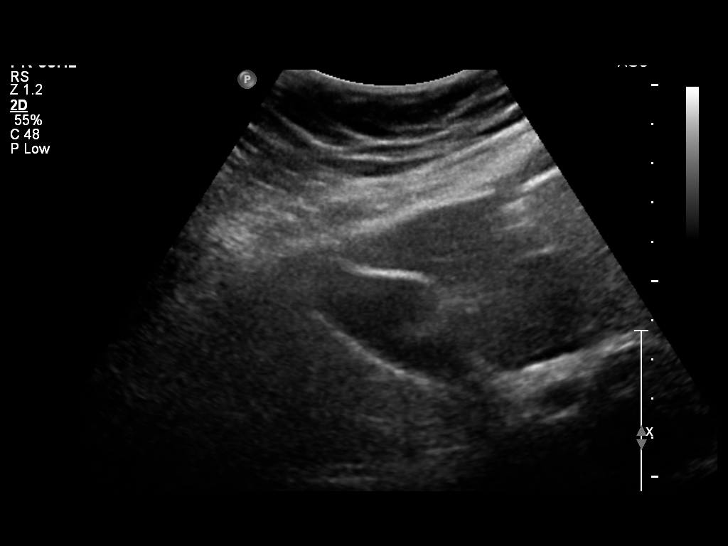
[im 41/76]
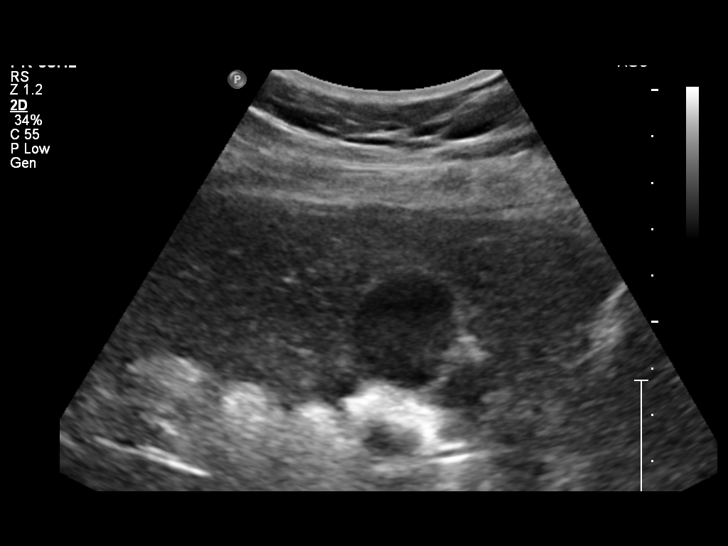
[im 47/76]
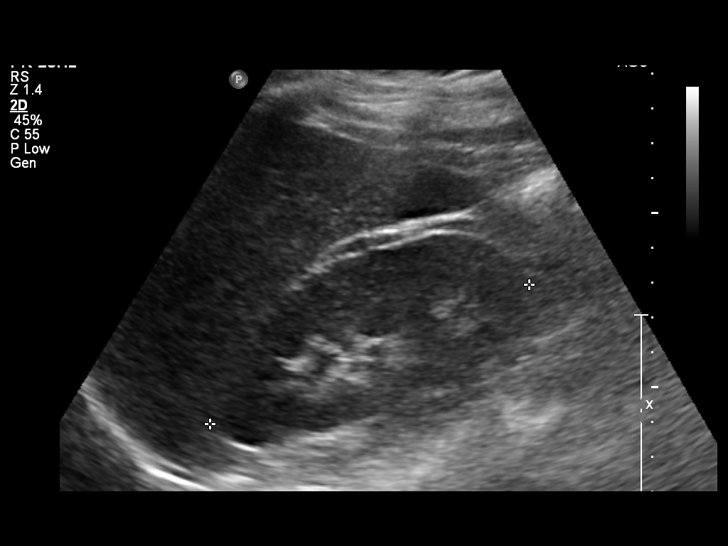
[im 51/76]
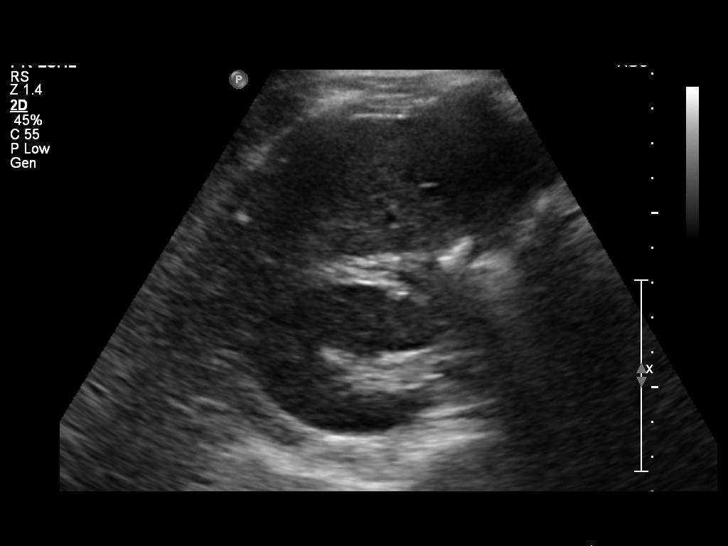
[im 57/76]
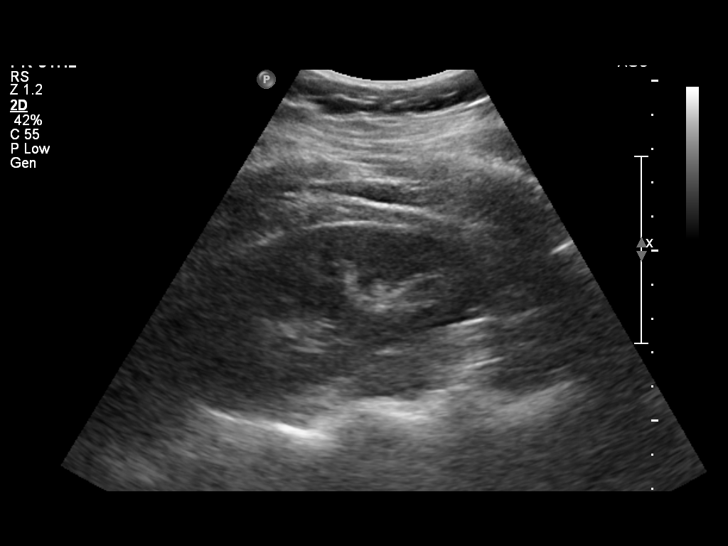
[im 63/76]
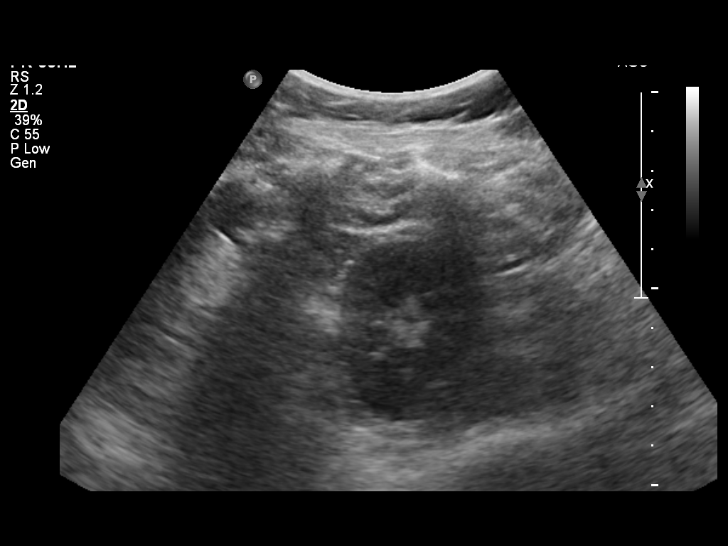
[im 69/76]
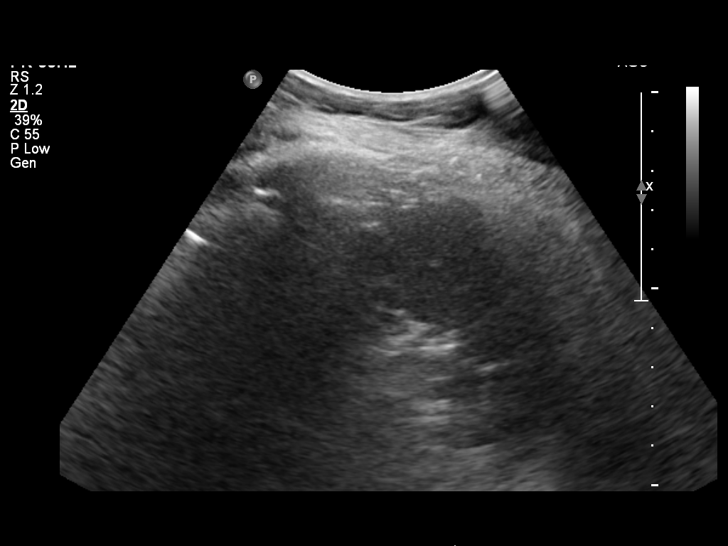
[im 76/76]
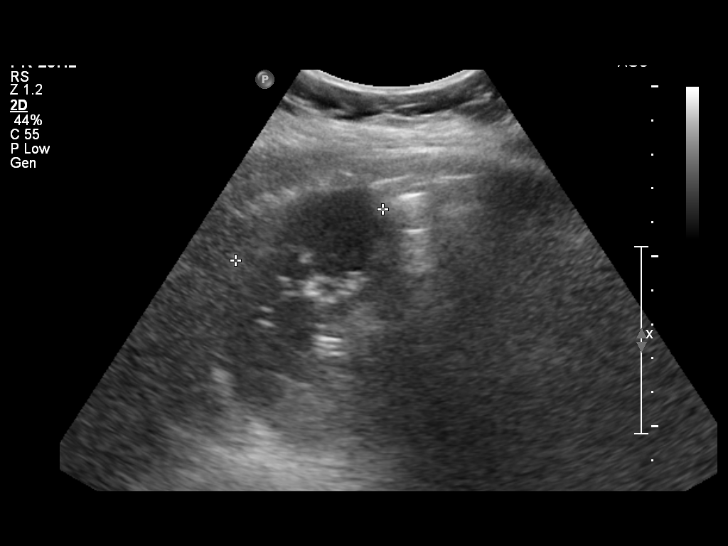

[14 of 25 positions shown; findings below may reference images not displayed]

FINDINGS: Technically good cold study due to body habitus.

Gallbladder:  No gallstones, gallbladder wall thickening, or
pericholecystic fluid. Sonographic Murphy's sign is negative.

Common Bile Duct:  Within normal limits in caliber.

Liver: No focal mass lesion identified.  Within normal limits in
parenchymal echogenicity.

IVC:  Appears normal.

Pancreas:  No abnormality identified.

Spleen:  Within normal limits in size and echotexture.

Right kidney:  Normal in size and parenchymal echogenicity.  No
evidence of mass or hydronephrosis.

Left kidney:  Normal in size and parenchymal echogenicity.  No
evidence of mass or hydronephrosis.

Abdominal Aorta:  No aneurysm identified.
IMPRESSION: Negative abdominal ultrasound.

## 2013-08-22 ENCOUNTER — Ambulatory Visit (INDEPENDENT_AMBULATORY_CARE_PROVIDER_SITE_OTHER): Payer: Managed Care, Other (non HMO) | Admitting: Emergency Medicine

## 2013-08-22 VITALS — BP 102/62 | HR 78 | Temp 98.7°F | Resp 16 | Ht 62.0 in | Wt 200.0 lb

## 2013-08-22 DIAGNOSIS — IMO0002 Reserved for concepts with insufficient information to code with codable children: Secondary | ICD-10-CM

## 2013-08-22 DIAGNOSIS — L03012 Cellulitis of left finger: Secondary | ICD-10-CM

## 2013-08-22 MED ORDER — SULFAMETHOXAZOLE-TRIMETHOPRIM 800-160 MG PO TABS: 1.0000 | ORAL_TABLET | Freq: Two times a day (BID) | ORAL | Status: DC

## 2013-08-22 NOTE — Patient Instructions (Addendum)
Paronychia Paronychia is an inflammatory reaction involving the folds of the skin surrounding the fingernail. This is commonly caused by an infection in the skin around a nail. The most common cause of paronychia is frequent wetting of the hands (as seen with bartenders, food servers, nurses or others who wet their hands). This makes the skin around the fingernail susceptible to infection by bacteria (germs) or fungus. Other predisposing factors are:  Aggressive manicuring.  Nail biting.  Thumb sucking. The most common cause is a staphylococcal (a type of germ) infection, or a fungal (Candida) infection. When caused by a germ, it usually comes on suddenly with redness, swelling, pus and is often painful. It may get under the nail and form an abscess (collection of pus), or form an abscess around the nail. If the nail itself is infected with a fungus, the treatment is usually prolonged and may require oral medicine for up to one year. Your caregiver will determine the length of time treatment is required. The paronychia caused by bacteria (germs) may largely be avoided by not pulling on hangnails or picking at cuticles. When the infection occurs at the tips of the finger it is called felon. When the cause of paronychia is from the herpes simplex virus (HSV) it is called herpetic whitlow. TREATMENT  When an abscess is present treatment is often incision and drainage. This means that the abscess must be cut open so the pus can get out. When this is done, the following home care instructions should be followed. HOME CARE INSTRUCTIONS   It is important to keep the affected fingers very dry. Rubber or plastic gloves over cotton gloves should be used whenever the hand must be placed in water.  Keep wound clean, dry and dressed as suggested by your caregiver between warm soaks or warm compresses.  Soak in warm water for fifteen to twenty minutes three to four times per day for bacterial infections. Fungal  infections are very difficult to treat, so often require treatment for long periods of time.  For bacterial (germ) infections take antibiotics (medicine which kill germs) as directed and finish the prescription, even if the problem appears to be solved before the medicine is gone.  Only take over-the-counter or prescription medicines for pain, discomfort, or fever as directed by your caregiver. SEEK IMMEDIATE MEDICAL CARE IF:  You have redness, swelling, or increasing pain in the wound.  You notice pus coming from the wound.  You have a fever.  You notice a bad smell coming from the wound or dressing. Document Released: 05/24/2001 Document Revised: 02/20/2012 Document Reviewed: 01/23/2009 [employer] Patient Information 2014 [employer].  

## 2013-08-22 NOTE — Progress Notes (Signed)
[facility] [address] [place] [address] [phone]  Date:  08/22/2013   Name:  Saralee Bolick   DOB:  1976/04/13   MRN:  [id number]  PCP:  Geraldo Pitter, MD    Chief Complaint: Thumb pain   History of Present Illness:  Kenise Barraco is a 37 y.o. very pleasant female patient who presents with the following:  Ill with pain and swelling in left thumb past few days.  No history of injury.  No fever or chills. No improvement with over the counter medications or other home remedies. Denies other complaint or health concern today.   Patient Active Problem List   Diagnosis Date Noted  . GERD 08/21/2008  . CHEST PAIN 08/21/2008    Past Medical History  Diagnosis Date  . Asthma     Past Surgical History  Procedure Laterality Date  . Abdominal hysterectomy    . Tubal ligation      History  Substance Use Topics  . Smoking status: Never Smoker   . Smokeless tobacco: Not on file  . Alcohol Use: Yes    Family History  Problem Relation Age of Onset  . Hypertension Mother   . Diabetes Father   . Diabetes Maternal Grandmother   . Hypertension Maternal Grandmother     Allergies  Allergen Reactions  . Oxycodone-Acetaminophen Itching    Medication list has been reviewed and updated.  Current Outpatient Prescriptions on File Prior to Visit  Medication Sig Dispense Refill  . albuterol (PROAIR HFA) 108 (90 BASE) MCG/ACT inhaler Inhale 2 puffs into the lungs every 6 (six) hours as needed. For wheezing and shortness of breath.      . beclomethasone (QVAR) 40 MCG/ACT inhaler Inhale 2 puffs into the lungs 2 (two) times daily.      . cetirizine (ZYRTEC) 10 MG tablet Take 10 mg by mouth daily.      . fexofenadine (ALLEGRA) 180 MG tablet Take 180 mg by mouth daily.       No current facility-administered medications on file prior to visit.    Review of Systems:  As per HPI, otherwise negative.    Physical Examination: Filed Vitals:   08/22/13 2038  BP: 102/62  Pulse: 78  Temp: 98.7 F (37.1 C)  Resp: 16   Filed Vitals:   08/22/13 2038  Height: 5\' 2"  (1.575 m)  Weight: 200 lb (90.719 kg)   Body mass index is 36.57 kg/(m^2). Ideal Body Weight: Weight in (lb) to have BMI = 25: 136.4   GEN: WDWN, NAD, Non-toxic, Alert & Oriented x 3 HEENT: Atraumatic, Normocephalic.  Ears and Nose: No external deformity. EXTR: No clubbing/cyanosis/edema NEURO: Normal gait.  PSYCH: Normally interactive. Conversant. Not depressed or anxious appearing.  Calm demeanor.  LEFT thumb:  Immature paronychia.  NATI   Assessment and Plan: Paronychia Septra Warm soaks Follow up if needed   Signed,  Phillips Odor, MD

## 2013-08-26 ENCOUNTER — Ambulatory Visit (INDEPENDENT_AMBULATORY_CARE_PROVIDER_SITE_OTHER): Payer: Managed Care, Other (non HMO) | Admitting: Emergency Medicine

## 2013-08-26 VITALS — BP 118/78 | HR 71 | Temp 97.9°F | Resp 18 | Ht 62.5 in | Wt 197.2 lb

## 2013-08-26 DIAGNOSIS — IMO0002 Reserved for concepts with insufficient information to code with codable children: Secondary | ICD-10-CM

## 2013-08-26 DIAGNOSIS — L03012 Cellulitis of left finger: Secondary | ICD-10-CM

## 2013-08-26 MED ORDER — HYDROCODONE-ACETAMINOPHEN 5-325 MG PO TABS: 1.0000 | ORAL_TABLET | ORAL | Status: DC | PRN

## 2013-08-26 MED ORDER — FLUCONAZOLE 150 MG PO TABS: 150.0000 mg | ORAL_TABLET | Freq: Once | ORAL | Status: DC

## 2013-08-26 MED ORDER — AMOXICILLIN-POT CLAVULANATE 875-125 MG PO TABS: 1.0000 | ORAL_TABLET | Freq: Two times a day (BID) | ORAL | Status: DC

## 2013-08-26 NOTE — Progress Notes (Signed)
[facility] [address] [place] [address] [phone]  Date:  08/26/2013   Name:  Annette Garrett   DOB:  02-Apr-1976   MRN:  [id number]  PCP:  Geraldo Pitter, MD    Chief Complaint: Follow-up   History of Present Illness:  Annette Garrett is a 37 y.o. very pleasant female patient who presents with the following:  Seen Thursday with a paronychia that was not ready to open.  Failed to improve with soaks and septra.  Now has increased pain and some purulent collection.  No cellulitis or fever or chills No improvement with over the counter medications or other home remedies. Denies other complaint or health concern today.   Patient Active Problem List   Diagnosis Date Noted  . GERD 08/21/2008  . CHEST PAIN 08/21/2008    Past Medical History  Diagnosis Date  . Asthma     Past Surgical History  Procedure Laterality Date  . Abdominal hysterectomy    . Tubal ligation      History  Substance Use Topics  . Smoking status: Never Smoker   . Smokeless tobacco: Never Used  . Alcohol Use: Yes     Comment: social    Family History  Problem Relation Age of Onset  . Hypertension Mother   . Diabetes Father   . Diabetes Maternal Grandmother   . Hypertension Maternal Grandmother     Allergies  Allergen Reactions  . Oxycodone-Acetaminophen Itching    Medication list has been reviewed and updated.  Current Outpatient Prescriptions on File Prior to Visit  Medication Sig Dispense Refill  . albuterol (PROAIR HFA) 108 (90 BASE) MCG/ACT inhaler Inhale 2 puffs into the lungs every 6 (six) hours as needed. For wheezing and shortness of breath.      . beclomethasone (QVAR) 40 MCG/ACT inhaler Inhale 2 puffs into the lungs 2 (two) times daily.      . cetirizine (ZYRTEC) 10 MG tablet Take 10 mg by mouth daily.      . fexofenadine (ALLEGRA) 180 MG tablet Take 180 mg by mouth daily.      Marland Kitchen sulfamethoxazole-trimethoprim (BACTRIM DS,SEPTRA DS) 800-160 MG per  tablet Take 1 tablet by mouth 2 (two) times daily.  20 tablet  0   No current facility-administered medications on file prior to visit.    Review of Systems:  As per HPI, otherwise negative.    Physical Examination: Filed Vitals:   08/26/13 2009  BP: 118/78  Pulse: 71  Temp: 97.9 F (36.6 C)  Resp: 18   Filed Vitals:   08/26/13 2009  Height: 5' 2.5" (1.588 m)  Weight: 197 lb 3.2 oz (89.449 kg)   Body mass index is 35.47 kg/(m^2). Ideal Body Weight: Weight in (lb) to have BMI = 25: 138.6   GEN: WDWN, NAD, Non-toxic, Alert & Oriented x 3 HEENT: Atraumatic, Normocephalic.  Ears and Nose: No external deformity. EXTR: No clubbing/cyanosis/edema NEURO: Normal gait.  PSYCH: Normally interactive. Conversant. Not depressed or anxious appearing.  Calm demeanor.  LEFT thumb:  Paronychia.    Assessment and Plan: Paronychia Drained with 18 gauge needle Add augmentin and vicodin   Signed,  Phillips Odor, MD

## 2013-08-26 NOTE — Patient Instructions (Signed)
Paronychia Paronychia is an inflammatory reaction involving the folds of the skin surrounding the fingernail. This is commonly caused by an infection in the skin around a nail. The most common cause of paronychia is frequent wetting of the hands (as seen with bartenders, food servers, nurses or others who wet their hands). This makes the skin around the fingernail susceptible to infection by bacteria (germs) or fungus. Other predisposing factors are:  Aggressive manicuring.  Nail biting.  Thumb sucking. The most common cause is a staphylococcal (a type of germ) infection, or a fungal (Candida) infection. When caused by a germ, it usually comes on suddenly with redness, swelling, pus and is often painful. It may get under the nail and form an abscess (collection of pus), or form an abscess around the nail. If the nail itself is infected with a fungus, the treatment is usually prolonged and may require oral medicine for up to one year. Your caregiver will determine the length of time treatment is required. The paronychia caused by bacteria (germs) may largely be avoided by not pulling on hangnails or picking at cuticles. When the infection occurs at the tips of the finger it is called felon. When the cause of paronychia is from the herpes simplex virus (HSV) it is called herpetic whitlow. TREATMENT  When an abscess is present treatment is often incision and drainage. This means that the abscess must be cut open so the pus can get out. When this is done, the following home care instructions should be followed. HOME CARE INSTRUCTIONS   It is important to keep the affected fingers very dry. Rubber or plastic gloves over cotton gloves should be used whenever the hand must be placed in water.  Keep wound clean, dry and dressed as suggested by your caregiver between warm soaks or warm compresses.  Soak in warm water for fifteen to twenty minutes three to four times per day for bacterial infections. Fungal  infections are very difficult to treat, so often require treatment for long periods of time.  For bacterial (germ) infections take antibiotics (medicine which kill germs) as directed and finish the prescription, even if the problem appears to be solved before the medicine is gone.  Only take over-the-counter or prescription medicines for pain, discomfort, or fever as directed by your caregiver. SEEK IMMEDIATE MEDICAL CARE IF:  You have redness, swelling, or increasing pain in the wound.  You notice pus coming from the wound.  You have a fever.  You notice a bad smell coming from the wound or dressing. Document Released: 05/24/2001 Document Revised: 02/20/2012 Document Reviewed: 01/23/2009 [employer] Patient Information 2014 [employer].  

## 2014-06-06 ENCOUNTER — Other Ambulatory Visit: Payer: Self-pay | Admitting: Family Medicine

## 2014-06-06 ENCOUNTER — Other Ambulatory Visit (HOSPITAL_COMMUNITY)
Admission: RE | Admit: 2014-06-06 | Discharge: 2014-06-06 | Disposition: A | Discharge status: Home / Self Care | Payer: Managed Care, Other (non HMO) | Source: Ambulatory Visit | Attending: Family Medicine | Admitting: Family Medicine

## 2014-06-06 DIAGNOSIS — Z01419 Encounter for gynecological examination (general) (routine) without abnormal findings: Secondary | ICD-10-CM | POA: Insufficient documentation

## 2014-06-09 LAB — CYTOLOGY - PAP

## 2014-08-13 ENCOUNTER — Emergency Department (HOSPITAL_COMMUNITY)
Admission: EM | Admit: 2014-08-13 | Discharge: 2014-08-13 | Disposition: A | Discharge status: Home / Self Care | Payer: Worker's Compensation | Attending: Emergency Medicine | Admitting: Emergency Medicine

## 2014-08-13 ENCOUNTER — Encounter (HOSPITAL_COMMUNITY): Payer: Self-pay | Admitting: Emergency Medicine

## 2014-08-13 ENCOUNTER — Emergency Department (HOSPITAL_COMMUNITY): Payer: Worker's Compensation

## 2014-08-13 DIAGNOSIS — Z79899 Other long term (current) drug therapy: Secondary | ICD-10-CM | POA: Diagnosis not present

## 2014-08-13 DIAGNOSIS — R51 Headache: Secondary | ICD-10-CM | POA: Insufficient documentation

## 2014-08-13 DIAGNOSIS — F411 Generalized anxiety disorder: Secondary | ICD-10-CM | POA: Insufficient documentation

## 2014-08-13 DIAGNOSIS — Y9389 Activity, other specified: Secondary | ICD-10-CM | POA: Insufficient documentation

## 2014-08-13 DIAGNOSIS — T621X1A Toxic effect of ingested berries, accidental (unintentional), initial encounter: Secondary | ICD-10-CM | POA: Insufficient documentation

## 2014-08-13 DIAGNOSIS — J45901 Unspecified asthma with (acute) exacerbation: Secondary | ICD-10-CM | POA: Diagnosis not present

## 2014-08-13 DIAGNOSIS — Y9289 Other specified places as the place of occurrence of the external cause: Secondary | ICD-10-CM | POA: Diagnosis not present

## 2014-08-13 DIAGNOSIS — R079 Chest pain, unspecified: Secondary | ICD-10-CM | POA: Diagnosis not present

## 2014-08-13 DIAGNOSIS — T622X1A Toxic effect of other ingested (parts of) plant(s), accidental (unintentional), initial encounter: Secondary | ICD-10-CM | POA: Diagnosis not present

## 2014-08-13 DIAGNOSIS — T7840XA Allergy, unspecified, initial encounter: Secondary | ICD-10-CM

## 2014-08-13 LAB — COMPREHENSIVE METABOLIC PANEL
ALBUMIN: 4 g/dL (ref 3.5–5.2)
ALT: 13 U/L (ref 0–35)
ANION GAP: 18 — AB (ref 5–15)
AST: 18 U/L (ref 0–37)
Alkaline Phosphatase: 49 U/L (ref 39–117)
BUN: 9 mg/dL (ref 6–23)
CHLORIDE: 101 meq/L (ref 96–112)
CO2: 20 mEq/L (ref 19–32)
CREATININE: 0.89 mg/dL (ref 0.50–1.10)
Calcium: 9.5 mg/dL (ref 8.4–10.5)
GFR, EST NON AFRICAN AMERICAN: 82 mL/min — AB (ref >90)
GLUCOSE: 84 mg/dL (ref 70–99)
Potassium: 3.7 mEq/L (ref 3.7–5.3)
Sodium: 139 mEq/L (ref 137–147)
Total Protein: 8 g/dL (ref 6.0–8.3)

## 2014-08-13 LAB — I-STAT CHEM 8, ED
BUN: 7 mg/dL (ref 6–23)
CHLORIDE: 106 meq/L (ref 96–112)
Calcium, Ion: 1.15 mmol/L (ref 1.12–1.23)
Creatinine, Ser: 0.9 mg/dL (ref 0.50–1.10)
GLUCOSE: 158 mg/dL — AB (ref 70–99)
HCT: 37 % (ref 36.0–46.0)
HEMOGLOBIN: 12.6 g/dL (ref 12.0–15.0)
POTASSIUM: 3.6 meq/L — AB (ref 3.7–5.3)
Sodium: 140 mEq/L (ref 137–147)
TCO2: 21 mmol/L (ref 0–100)

## 2014-08-13 LAB — CBC WITH DIFFERENTIAL/PLATELET
BASOS PCT: 0 % (ref 0–1)
Basophils Absolute: 0 10*3/uL (ref 0.0–0.1)
Eosinophils Absolute: 0.1 10*3/uL (ref 0.0–0.7)
Eosinophils Relative: 2 % (ref 0–5)
HEMATOCRIT: 34.9 % — AB (ref 36.0–46.0)
HEMOGLOBIN: 11.8 g/dL — AB (ref 12.0–15.0)
Lymphocytes Relative: 51 % — ABNORMAL HIGH (ref 12–46)
Lymphs Abs: 4.3 10*3/uL — ABNORMAL HIGH (ref 0.7–4.0)
MCH: 30.1 pg (ref 26.0–34.0)
MCHC: 33.8 g/dL (ref 30.0–36.0)
MCV: 89 fL (ref 78.0–100.0)
MONO ABS: 0.8 10*3/uL (ref 0.1–1.0)
MONOS PCT: 10 % (ref 3–12)
NEUTROS ABS: 3 10*3/uL (ref 1.7–7.7)
Neutrophils Relative %: 37 % — ABNORMAL LOW (ref 43–77)
Platelets: 291 10*3/uL (ref 150–400)
RBC: 3.92 MIL/uL (ref 3.87–5.11)
RDW: 13.8 % (ref 11.5–15.5)
WBC: 8.2 10*3/uL (ref 4.0–10.5)

## 2014-08-13 LAB — TROPONIN I

## 2014-08-13 LAB — I-STAT TROPONIN, ED: TROPONIN I, POC: 0 ng/mL (ref 0.00–0.08)

## 2014-08-13 MED ORDER — PREDNISONE 20 MG PO TABS: ORAL_TABLET | ORAL | Status: DC

## 2014-08-13 MED ORDER — ALBUTEROL SULFATE (2.5 MG/3ML) 0.083% IN NEBU
5.0000 mg | INHALATION_SOLUTION | Freq: Once | RESPIRATORY_TRACT | Status: AC
  Administered 2014-08-13: 5 mg via RESPIRATORY_TRACT
  Filled 2014-08-13: qty 6

## 2014-08-13 MED ORDER — SODIUM CHLORIDE 0.9 % IV BOLUS (SEPSIS)
1000.0000 mL | Freq: Once | INTRAVENOUS | Status: AC
  Administered 2014-08-13: 1000 mL via INTRAVENOUS

## 2014-08-13 MED ORDER — METHYLPREDNISOLONE SODIUM SUCC 125 MG IJ SOLR
125.0000 mg | Freq: Once | INTRAMUSCULAR | Status: AC
  Administered 2014-08-13: 125 mg via INTRAVENOUS
  Filled 2014-08-13: qty 2

## 2014-08-13 MED ORDER — EPINEPHRINE 0.3 MG/0.3ML IJ SOAJ: 0.3000 mg | INTRAMUSCULAR | Status: DC | PRN

## 2014-08-13 MED ORDER — DIPHENHYDRAMINE HCL 50 MG/ML IJ SOLN
25.0000 mg | Freq: Once | INTRAMUSCULAR | Status: AC
  Administered 2014-08-13: 25 mg via INTRAVENOUS
  Filled 2014-08-13: qty 1

## 2014-08-13 MED ORDER — KETOROLAC TROMETHAMINE 30 MG/ML IJ SOLN
30.0000 mg | Freq: Once | INTRAMUSCULAR | Status: AC
  Administered 2014-08-13: 30 mg via INTRAVENOUS
  Filled 2014-08-13: qty 1

## 2014-08-13 MED ORDER — MORPHINE SULFATE 4 MG/ML IJ SOLN: 4.0000 mg | Freq: Once | INTRAMUSCULAR | Status: DC

## 2014-08-13 MED ORDER — GI COCKTAIL ~~LOC~~
30.0000 mL | Freq: Once | ORAL | Status: AC
  Administered 2014-08-13: 30 mL via ORAL
  Filled 2014-08-13: qty 30

## 2014-08-13 NOTE — Discharge Instructions (Signed)
Take prednisone as prescribed.   Take benadryl as needed for itchiness.  Carry epi pen on you at all times.   Follow up with your doctor.   Return to ER if you have trouble breathing, shortness of breath, chest pain.

## 2014-08-13 NOTE — ED Notes (Signed)
Pt to CXR.

## 2014-08-13 NOTE — ED Notes (Signed)
Pt c/o sharp central and L side chest pain, headache, and generally feeling bad after an allergic reaction this afternoon.  Pain score 6/10.  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  Sts that she was eating a sandwich when she started feeling funny.  Pt reports using an epi-pen.  Denies SOB.  NAD noted.  Pt speaking in full sentences.

## 2014-08-13 NOTE — ED Provider Notes (Signed)
CSN: [id number]     Arrival date & time 08/13/14  1426 History   First MD Initiated Contact with Patient 08/13/14 1528     Chief Complaint  Patient presents with  . Allergic Reaction  . Chest Pain  . Headache     (Consider location/radiation/quality/duration/timing/severity/associated sxs/prior Treatment) The history is provided by the patient.  Annette Garrett is a 38 y.o. female hx of asthma here with possible allergic reaction. She ate bread with sunflower seeds today around 1:50 PM. Subsequently, had some chest pain and shortness of breath. Had some headache as well. She received IM epi at around 2pm. Denies rash. Still has some chest tightness. Denies throat closing. Had previous allergy to subflower seeds.   Past Medical History  Diagnosis Date  . Asthma    Past Surgical History  Procedure Laterality Date  . Abdominal hysterectomy    . Tubal ligation     Family History  Problem Relation Age of Onset  . Hypertension Mother   . Diabetes Father   . Diabetes Maternal Grandmother   . Hypertension Maternal Grandmother    History  Substance Use Topics  . Smoking status: Never Smoker   . Smokeless tobacco: Never Used  . Alcohol Use: Yes     Comment: social   OB History   Grav Para Term Preterm Abortions TAB SAB Ect Mult Living   Review of Systems  Cardiovascular: Positive for chest pain.  Neurological: Positive for headaches.  All other systems reviewed and are negative.     Allergies  Other and Oxycodone-acetaminophen  Home Medications   Prior to Admission medications   Medication Sig Start Date End Date Taking? Authorizing Provider  albuterol (PROAIR HFA) 108 (90 BASE) MCG/ACT inhaler Inhale 2 puffs into the lungs every 6 (six) hours as needed. For wheezing and shortness of breath.   Yes Historical Provider, MD  cetirizine (ZYRTEC) 10 MG tablet Take 10 mg by mouth daily.   Yes Historical Provider, MD  fexofenadine (ALLEGRA) 180 MG tablet  Take 180 mg by mouth daily.   Yes Historical Provider, MD   BP 110/69  Pulse 108  Temp(Src) 98.5 F (36.9 C) (Oral)  Resp 18  Ht  (1.6 m)  Wt 200 lb (90.719 kg)  BMI 35.44 kg/m2  SpO2 95% Physical Exam  Nursing note and vitals reviewed. Constitutional: She is oriented to person, place, and time. She appears well-developed and well-nourished.  Slightly anxious, protecting airway   HENT:  Head: Normocephalic.  Mouth/Throat: Oropharynx is clear and moist.  Eyes: Conjunctivae and EOM are normal. Pupils are equal, round, and reactive to light.  Neck: Normal range of motion. Neck supple.  No stridor   Cardiovascular: Normal rate, regular rhythm and normal heart sounds.   Pulmonary/Chest: Effort normal.  Mod air movement, no wheezing   Abdominal: Soft. Bowel sounds are normal. She exhibits no distension. There is no tenderness. There is no rebound and no guarding.  Musculoskeletal: Normal range of motion. She exhibits no edema and no tenderness.  Neurological: She is alert and oriented to person, place, and time. No cranial nerve deficit. Coordination normal.  Skin: Skin is warm and dry.  Psychiatric: She has a normal mood and affect. Her behavior is normal. Judgment and thought content normal.    ED Course  Procedures (including critical care time) Labs Review Labs Reviewed  CBC WITH DIFFERENTIAL - Abnormal; Notable for the following:  Hemoglobin 11.8 (*)    HCT 34.9 (*)    Neutrophils Relative % 37 (*)    Lymphocytes Relative 51 (*)    Lymphs Abs 4.3 (*)    All other components within normal limits  COMPREHENSIVE METABOLIC PANEL - Abnormal; Notable for the following:    Total Bilirubin <0.2 (*)    GFR calc non Af Amer 82 (*)    Anion gap 18 (*)    All other components within normal limits  I-STAT CHEM 8, ED - Abnormal; Notable for the following:    Potassium 3.6 (*)    Glucose, Bld 158 (*)    All other components within normal limits  TROPONIN I  Rosezena Sensor, ED    Imaging Review Dg Chest 2 View  08/13/2014   CLINICAL DATA:  Allergic reaction.  Chest pain and headache  EXAM: CHEST  2 VIEW  COMPARISON:  06/27/2013  FINDINGS: Normal heart size and mediastinal contours. Stable prominence of the interstitium diffusely. No acute infiltrate or edema. No effusion or pneumothorax. No acute osseous findings.  IMPRESSION: No active cardiopulmonary disease.   Electronically Signed   By: Tiburcio Pea M.D.   On: 08/13/2014 19:08     EKG Interpretation   Date/Time:  Wednesday August 13 2014 14:47:30 EDT Ventricular Rate:  74 PR Interval:  136 QRS Duration: 82 QT Interval:  381 QTC Calculation: 423 R Axis:   14 Text Interpretation:  Sinus rhythm No significant change since last  tracing Confirmed by Dustyn Dansereau  MD, Aja Whitehair ([id number]) on 08/13/2014 3:35:07 PM      MDM   Final diagnoses:  None   Annette Garrett is a 38 y.o. female here with allergic reaction. Given epi prior to arrival. Will give solumedrol, benadryl, nebs. Will observe for several hours.   7:12 PM Patient observed in the ED for several hours. Had some chest pain but trop x 2 and CXR unremarkable. Throat not closing, felt better after steroids. Has epi pen at home. Will d/c home with course of steroids.   Richardean Canal, MD 08/13/14 [phone]

## 2014-08-13 NOTE — ED Notes (Signed)
Pt ambulatory with steady gait to void in BR.  

## 2014-08-13 NOTE — ED Notes (Signed)
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  Pt reports initially felt "weird", nausea, lip swelling, pt reports taking own epi-pen at 1405.  Pt denies specific complaints at this time.  Skin PWD.  Speaking full/clear sentences, rr even/un-lab.  LSCTAB.  No difficulty clearing secretions or maintaining airway.  NAD.

## 2014-08-13 NOTE — ED Notes (Signed)
Called main lab about labs that were drawn over 2 hours ago - claims it was probably "forgotten" about since it has been so busy down there.  Made Dr Silverio Lay aware of this.

## 2014-08-16 ENCOUNTER — Emergency Department (HOSPITAL_COMMUNITY)
Admission: EM | Admit: 2014-08-16 | Discharge: 2014-08-16 | Disposition: A | Discharge status: Home / Self Care | Payer: Worker's Compensation | Attending: Emergency Medicine | Admitting: Emergency Medicine

## 2014-08-16 ENCOUNTER — Emergency Department (HOSPITAL_COMMUNITY)
Admission: EM | Admit: 2014-08-16 | Discharge: 2014-08-16 | Discharge status: Against Medical Advice | Payer: Worker's Compensation | Source: Home / Self Care | Attending: Emergency Medicine | Admitting: Emergency Medicine

## 2014-08-16 ENCOUNTER — Encounter (HOSPITAL_COMMUNITY): Payer: Self-pay | Admitting: Emergency Medicine

## 2014-08-16 DIAGNOSIS — IMO0002 Reserved for concepts with insufficient information to code with codable children: Secondary | ICD-10-CM | POA: Insufficient documentation

## 2014-08-16 DIAGNOSIS — L272 Dermatitis due to ingested food: Secondary | ICD-10-CM | POA: Insufficient documentation

## 2014-08-16 DIAGNOSIS — J45909 Unspecified asthma, uncomplicated: Secondary | ICD-10-CM

## 2014-08-16 DIAGNOSIS — Z79899 Other long term (current) drug therapy: Secondary | ICD-10-CM | POA: Diagnosis not present

## 2014-08-16 DIAGNOSIS — R220 Localized swelling, mass and lump, head: Secondary | ICD-10-CM | POA: Insufficient documentation

## 2014-08-16 DIAGNOSIS — R221 Localized swelling, mass and lump, neck: Secondary | ICD-10-CM

## 2014-08-16 DIAGNOSIS — T781XXA Other adverse food reactions, not elsewhere classified, initial encounter: Secondary | ICD-10-CM

## 2014-08-16 MED ORDER — PREDNISONE 20 MG PO TABS: ORAL_TABLET | ORAL | Status: DC

## 2014-08-16 MED ORDER — FAMOTIDINE IN NACL 20-0.9 MG/50ML-% IV SOLN
20.0000 mg | Freq: Once | INTRAVENOUS | Status: AC
  Administered 2014-08-16: 20 mg via INTRAVENOUS
  Filled 2014-08-16: qty 50

## 2014-08-16 MED ORDER — DIPHENHYDRAMINE HCL 25 MG PO TABS: 25.0000 mg | ORAL_TABLET | Freq: Four times a day (QID) | ORAL | Status: DC

## 2014-08-16 MED ORDER — METHYLPREDNISOLONE SODIUM SUCC 125 MG IJ SOLR
125.0000 mg | Freq: Once | INTRAMUSCULAR | Status: AC
  Administered 2014-08-16: 125 mg via INTRAVENOUS
  Filled 2014-08-16: qty 2

## 2014-08-16 MED ORDER — FAMOTIDINE 20 MG PO TABS: 20.0000 mg | ORAL_TABLET | Freq: Two times a day (BID) | ORAL | Status: DC

## 2014-08-16 MED ORDER — DIPHENHYDRAMINE HCL 50 MG/ML IJ SOLN
25.0000 mg | Freq: Once | INTRAMUSCULAR | Status: AC
  Administered 2014-08-16: 25 mg via INTRAVENOUS
  Filled 2014-08-16: qty 1

## 2014-08-16 NOTE — ED Notes (Signed)
Pt reports that she ate bread with sunflower seeds 3 days ago and was seen at this facility. Pt was rx'd steroids and is still taking them. Pt states that she woke up this am with angioedema. Pt states "I don't know if I need to hit my epi or not." Pt is A&O and in NAD. VSS.

## 2014-08-16 NOTE — Discharge Instructions (Signed)
Food Allergy and Anaphylaxis  A food allergy occurs when the body reacts to proteins found in food. In the most common type of food allergy, the immune system creates chemicals usually made to fight germs (antibodies). These chemicals cause problems when the protein is eaten. Problems can also happen when the food is touched or bits of food get into the air (such as while cooking) near someone who is allergic. The problems caused are the allergic symptoms.  This type of food allergy must be taken seriously. Even very small amounts of a food can cause a reaction. Severe reactions can be sudden and potentially fatal. This type of food allergy can vary from mild to life threatening (anaphylaxis). It is impossible to predict what the next reaction will be like based on previous reactions.   There can be other problems with food that are not actually allergies. This document only reviews food allergies.  CAUSES   It is not known why some people develop food allergy. It may be more common in families with other allergic problems. Any food can cause allergies but the most common ones are:  · Peanuts.  · Tree nuts (such as almonds, walnuts, hazelnuts, and pecans).  · Fish (such as bass, flounder, and cod).  · Shellfish (such as crab, lobster, and shrimp).  · Milk.  · Eggs.  · Wheat.  · Soybeans.  SYMPTOMS   The symptoms of food allergy generally start within minutes to 2 hours after contact with the allergen. The symptoms can remain the same for several hours or get worse. Sometimes the reaction seems to improve only to return (often worse) later. Common signs/symptoms of a reaction include any or all of the following:  · Skin: hives, itching, swelling, flushing.  · Eyes: red, watery, itchy, swollen.  · Nose: congested, runny, sneezing.  · Mouth/throat: swelling of lips, tongue and throat. Itchy throat, hoarseness, choking sensation.  · Lungs: Cough, difficulty breathing, wheezing (whistling noise when breathing  out).  · Gastrointestinal tract: Nausea (feeling sick to one's stomach), vomiting, diarrhea, cramps.  · Heart and circulation: dizziness, weakness, fainting, turning pale, fast, slow or irregular pulse.  · Nervous system: confusion, fear, sense of doom.  Anaphylaxis is the most serious food allergy reaction. It can rapidly cause throat and tongue swelling, difficulty breathing, low blood pressure, collapse and cardiac arrest (heart stops breathing).  DIAGNOSIS   The diagnosis of food allergy is made in part on the history of prior reactions. To prove the diagnosis and to find what foods are responsible, your caregiver may suggest:  · Allergy skin tests - usually done by allergists in a setting where emergency treatment is available.  · Blood tests for allergy.  · Food challenges - suspected food is given and the person is observed in a setting where emergency treatment is available.  · Food diary - recording foods eaten and reactions.  · Elimination diet - suspected food(s) are removed from the diet.  TREATMENT   Your caregiver may prescribe medicines to treat an allergic reaction such as:  · Epinephrine (also called adrenaline), which comes in a self-injecting device. This is the most important medicine for treating serious food allergies.  · Asthma medicine - usually inhaled - to treat breathing problems - in addition to epinephrine.  · Antihistamines - to treat swelling and itching - in addition to epinephrine.  · Steroids - given to calm down a serious reaction.  Children can outgrow certain food allergies (like milk and   eggs). Peanut and tree nut allergies are less likely to be outgrown. Because of this, sometimes repeat allergy testing is done.  HOME CARE INSTRUCTIONS   Avoid contact with the offending food. Strict avoidance is the only way to prevent a reaction.   · Keep the food out of the house.  · Learn how to read food labels in order to spot the presence of any food you are allergic to. If a packaged  food product does not contain an ingredient label, avoid the food product.  · If you must have the offending food in the house, discuss how to avoid it with your caregiver.  Be especially careful when eating in a restaurant.  · Ask the cook or manager (not the waiter) if foods you are allergic to are found in any items on the menu.  · Avoid:  ¨ Fried foods since oil can keep proteins from previously cooked foods.  ¨ Ice cream parlors, Asian restaurants and bakeries - these often use peanut or tree nut ingredients.  ¨ Buffets, picnics, school lunches and salad bars because of the risk of contact with foods you are allergic to.  ¨ Food prepared in a blender or shared grill.  · Request that food be prepared with clean utensils or pans to avoid contamination from prior foods.  · Let the staff know you have allergies that can cause serious reactions or death.  · If you have a reaction, administer treatment and tell the staff to immediately call for emergency services ( 911 in the [place]  If planning travel by air, inform the airline ahead of time (when making the reservation) that you have a food allergy.  Wear a medical identification bracelet or necklace (such as one offered by [employer]) noting your allergy.   If an epinephrine self-injector device is prescribed:  · Carry your device everywhere at all times.  · Learn how to use the device. Practice by injecting an expired injector into an orange.  · Teach all family members, teachers, coaches, etc to use the injector.  · Make an injector available at school, work, etc.  · Keep a spare injector in your car.  · Educate others about your allergy. This includes school teachers and other school personnel. Tell them what you need to avoid, the symptoms of an allergic reaction, and how others can help during an allergic emergency.  · If you experience a subsequent allergic reaction, administer epinephrine promptly and immediately call for emergency services (911 in the  [place]  SEEK MEDICAL CARE IF:   · You have questions about food allergy or its treatments.  · Allergy reactions are getting worse or more frequent.  · You suspect new food allergies.  SEEK IMMEDIATE MEDICAL CARE IF:   · You or your child has a serious allergic reaction, even if you have given a shot of epinephrine.  · Symptoms return after taking prescribed treatments.  MAKE SURE YOU:   · Understand these instructions.  · Will watch your condition.  · Will get help right away if you are not doing well or get worse.  Document Released: 09/06/2008 Document Revised: 02/20/2012 Document Reviewed: 10/15/2008  [employer]® Patient Information ©2015 [employer]. This information is not intended to replace advice given to you by your health care provider. Make sure you discuss any questions you have with your health care provider.

## 2014-08-16 NOTE — ED Notes (Signed)
Pt left room and went to [facility] hospital.

## 2014-08-16 NOTE — ED Notes (Signed)
Pt reports being seen for allergic reaction to sunflower seeds at [facility] on Wednesday. Last night began having lip swelling last night. C/o tongue tingling. Denies throat swelling or sob. Has not been exposed to any further allergens or sunflower seeds since Wednesday to her knowledge.

## 2014-08-16 NOTE — ED Notes (Addendum)
Pt left and went to [facility] emergency dept. Did not notify staff. Left prior to seeing MD.

## 2014-08-16 NOTE — ED Provider Notes (Signed)
CSN: [id number]     Arrival date & time 08/16/14  0805 History   First MD Initiated Contact with Patient 08/16/14 [phone]     Chief Complaint  Patient presents with  . allergice reaction      (Consider location/radiation/quality/duration/timing/severity/associated sxs/prior Treatment) Patient is a 38 y.o. female presenting with allergic reaction. The history is provided by the patient. No language interpreter was used.  Allergic Reaction Presenting symptoms: swelling   Severity:  Moderate Prior allergic episodes:  Food/nut allergies Context: food   Relieved by:  None tried Worsened by:  Nothing tried Ineffective treatments:  Steroids   Past Medical History  Diagnosis Date  . Asthma    Past Surgical History  Procedure Laterality Date  . Abdominal hysterectomy    . Tubal ligation     Family History  Problem Relation Age of Onset  . Hypertension Mother   . Diabetes Father   . Diabetes Maternal Grandmother   . Hypertension Maternal Grandmother    History  Substance Use Topics  . Smoking status: Never Smoker   . Smokeless tobacco: Never Used  . Alcohol Use: Yes     Comment: social   OB History   Grav Para Term Preterm Abortions TAB SAB Ect Mult Living   Review of Systems  Constitutional: Negative for fever, chills, diaphoresis, activity change, appetite change and fatigue.  HENT: Positive for facial swelling. Negative for congestion, rhinorrhea and sore throat.   Eyes: Negative for photophobia and discharge.  Respiratory: Negative for cough, chest tightness and shortness of breath.   Cardiovascular: Negative for chest pain, palpitations and leg swelling.  Gastrointestinal: Negative for nausea, vomiting, abdominal pain and diarrhea.  Endocrine: Negative for polydipsia and polyuria.  Genitourinary: Negative for dysuria, frequency, difficulty urinating and pelvic pain.  Musculoskeletal: Negative for arthralgias, back pain, neck pain and neck stiffness.   Skin: Negative for color change and wound.  Allergic/Immunologic: Negative for immunocompromised state.  Neurological: Negative for facial asymmetry, weakness, numbness and headaches.  Hematological: Does not bruise/bleed easily.  Psychiatric/Behavioral: Negative for confusion and agitation.      Allergies  Other and Oxycodone-acetaminophen  Home Medications   Prior to Admission medications   Medication Sig Start Date End Date Taking? Authorizing Provider  albuterol (PROAIR HFA) 108 (90 BASE) MCG/ACT inhaler Inhale 2 puffs into the lungs every 6 (six) hours as needed. For wheezing and shortness of breath.   Yes Historical Provider, MD  cetirizine (ZYRTEC) 10 MG tablet Take 10 mg by mouth daily.   Yes Historical Provider, MD  EPINEPHrine (EPIPEN) 0.3 mg/0.3 mL IJ SOAJ injection Inject 0.3 mLs (0.3 mg total) into the muscle as needed. 08/13/14  Yes Richardean Canal, MD  fexofenadine (ALLEGRA) 180 MG tablet Take 180 mg by mouth daily.   Yes Historical Provider, MD  predniSONE (DELTASONE) 20 MG tablet Take 60 mg daily x 2 days, 40 mg daily x 2 days, then 20 mg daily x 2 days 08/13/14  Yes Richardean Canal, MD  diphenhydrAMINE (BENADRYL) 25 MG tablet Take 1 tablet (25 mg total) by mouth every 6 (six) hours. For 3 days 08/16/14   Toy Cookey, MD  famotidine (PEPCID) 20 MG tablet Take 1 tablet (20 mg total) by mouth 2 (two) times daily. For 3 days 08/16/14   Toy Cookey, MD  predniSONE (DELTASONE) 20 MG tablet 3 tabs po day one (starting 9/6), then 2 po daily x  3 days 08/16/14   Toy Cookey, MD   BP 149/82  Pulse 74  Temp(Src) 98.3 F (36.8 C) (Oral)  Resp 17  SpO2 100% Physical Exam  Constitutional: She is oriented to person, place, and time. She appears well-developed and well-nourished. No distress.  HENT:  Head: Normocephalic and atraumatic.  Mouth/Throat: No oropharyngeal exudate.    Eyes: Pupils are equal, round, and reactive to light.  Neck: Normal range of motion. Neck supple.    Cardiovascular: Normal rate, regular rhythm and normal heart sounds.  Exam reveals no gallop and no friction rub.   No murmur heard. Pulmonary/Chest: Effort normal and breath sounds normal. No respiratory distress. She has no wheezes. She has no rales.  Abdominal: Soft. Bowel sounds are normal. She exhibits no distension and no mass. There is no tenderness. There is no rebound and no guarding.  Musculoskeletal: Normal range of motion. She exhibits no edema and no tenderness.  Neurological: She is alert and oriented to person, place, and time.  Skin: Skin is warm and dry.  Psychiatric: She has a normal mood and affect.    ED Course  Procedures (including critical care time) Labs Review Labs Reviewed - No data to display  Imaging Review No results found.   EKG Interpretation None      MDM   Final diagnoses:  Allergic reaction to food    Pt is a 38 y.o. female with Pmhx as above who presents with return of lps swelling and tongue "tingling" this morning when she woke up. She says throat doesn't feel swollen, but she is more aware of the sensation of swallowing than usual. She was itchy earlier, but no rash. No n/v. She was seen 2 days ago for allergic reaction to sunflower seed exposure, was d/c'd home on PO pred. On PE, VSS, pt in NAD. She has edema of both lips, but no appreciable intraoral edema. Cardiopulm exam benign, abdominal exam benign, no rashes. Symptoms do not meet criteria for anaphylaxis. Will give solumedrol, Pepcid, benadryl and observe.  Pt observed approx 2.5 hrs after treatment, pt feeling no worse. Lips looking improved. Will d/c home with 3 addition days of prednisone, 3 days of scheduled benadryl & Pepcid. She has epi-pen refills. Return precautions given for new or worsening symptoms including s/sx anaphylaxis or worsening swelling.          Toy Cookey, MD 08/16/14 [phone]

## 2014-08-16 NOTE — ED Notes (Signed)
Per pt, states she was seen here for same symptoms on the 2nd-allergy to sunflower seeds-states lips and face still swollen and tongue tingling

## 2014-09-20 ENCOUNTER — Emergency Department (HOSPITAL_COMMUNITY)
Admission: EM | Admit: 2014-09-20 | Discharge: 2014-09-20 | Disposition: A | Discharge status: Home / Self Care | Payer: Managed Care, Other (non HMO) | Attending: Emergency Medicine | Admitting: Emergency Medicine

## 2014-09-20 ENCOUNTER — Emergency Department (HOSPITAL_COMMUNITY): Payer: Managed Care, Other (non HMO)

## 2014-09-20 ENCOUNTER — Encounter (HOSPITAL_COMMUNITY): Payer: Self-pay | Admitting: Emergency Medicine

## 2014-09-20 DIAGNOSIS — Z79899 Other long term (current) drug therapy: Secondary | ICD-10-CM | POA: Diagnosis not present

## 2014-09-20 DIAGNOSIS — J45909 Unspecified asthma, uncomplicated: Secondary | ICD-10-CM | POA: Insufficient documentation

## 2014-09-20 DIAGNOSIS — R109 Unspecified abdominal pain: Secondary | ICD-10-CM

## 2014-09-20 DIAGNOSIS — R1013 Epigastric pain: Secondary | ICD-10-CM | POA: Insufficient documentation

## 2014-09-20 LAB — COMPREHENSIVE METABOLIC PANEL
ALT: 10 U/L (ref 0–35)
AST: 15 U/L (ref 0–37)
Albumin: 3.8 g/dL (ref 3.5–5.2)
Alkaline Phosphatase: 56 U/L (ref 39–117)
Anion gap: 8 (ref 5–15)
BUN: 7 mg/dL (ref 6–23)
CO2: 27 mEq/L (ref 19–32)
Calcium: 9 mg/dL (ref 8.4–10.5)
Chloride: 105 mEq/L (ref 96–112)
Creatinine, Ser: 0.87 mg/dL (ref 0.50–1.10)
GFR calc Af Amer: >90 mL/min (ref >90)
GFR calc non Af Amer: 84 mL/min — ABNORMAL LOW (ref >90)
Glucose, Bld: 83 mg/dL (ref 70–99)
Potassium: 3.8 mEq/L (ref 3.7–5.3)
Sodium: 140 mEq/L (ref 137–147)
Total Bilirubin: <0.2 mg/dL — ABNORMAL LOW (ref 0.3–1.2)
Total Protein: 7.6 g/dL (ref 6.0–8.3)

## 2014-09-20 LAB — LIPASE, BLOOD: Lipase: 29 U/L (ref 11–59)

## 2014-09-20 LAB — CBC WITH DIFFERENTIAL/PLATELET
Basophils Absolute: 0 10*3/uL (ref 0.0–0.1)
Basophils Relative: 0 % (ref 0–1)
Eosinophils Absolute: 0.2 10*3/uL (ref 0.0–0.7)
Eosinophils Relative: 2 % (ref 0–5)
HCT: 33.5 % — ABNORMAL LOW (ref 36.0–46.0)
Hemoglobin: 11.2 g/dL — ABNORMAL LOW (ref 12.0–15.0)
Lymphocytes Relative: 48 % — ABNORMAL HIGH (ref 12–46)
Lymphs Abs: 3.4 10*3/uL (ref 0.7–4.0)
MCH: 30.2 pg (ref 26.0–34.0)
MCHC: 33.4 g/dL (ref 30.0–36.0)
MCV: 90.3 fL (ref 78.0–100.0)
Monocytes Absolute: 0.7 10*3/uL (ref 0.1–1.0)
Monocytes Relative: 9 % (ref 3–12)
Neutro Abs: 3 10*3/uL (ref 1.7–7.7)
Neutrophils Relative %: 41 % — ABNORMAL LOW (ref 43–77)
Platelets: 333 10*3/uL (ref 150–400)
RBC: 3.71 MIL/uL — ABNORMAL LOW (ref 3.87–5.11)
RDW: 13.9 % (ref 11.5–15.5)
WBC: 7.3 10*3/uL (ref 4.0–10.5)

## 2014-09-20 LAB — URINALYSIS, ROUTINE W REFLEX MICROSCOPIC
Bilirubin Urine: NEGATIVE
Glucose, UA: NEGATIVE mg/dL
Hgb urine dipstick: NEGATIVE
Ketones, ur: NEGATIVE mg/dL
Leukocytes, UA: NEGATIVE
Nitrite: NEGATIVE
Protein, ur: NEGATIVE mg/dL
Specific Gravity, Urine: 1.012 (ref 1.005–1.030)
Urobilinogen, UA: 0.2 mg/dL (ref 0.0–1.0)
pH: 7 (ref 5.0–8.0)

## 2014-09-20 MED ORDER — SODIUM CHLORIDE 0.9 % IV BOLUS (SEPSIS)
1000.0000 mL | Freq: Once | INTRAVENOUS | Status: AC
  Administered 2014-09-20: 1000 mL via INTRAVENOUS

## 2014-09-20 MED ORDER — PANTOPRAZOLE SODIUM 20 MG PO TBEC: 20.0000 mg | DELAYED_RELEASE_TABLET | Freq: Two times a day (BID) | ORAL | Status: DC

## 2014-09-20 MED ORDER — ONDANSETRON HCL 4 MG/2ML IJ SOLN
4.0000 mg | Freq: Once | INTRAMUSCULAR | Status: AC
  Administered 2014-09-20: 4 mg via INTRAVENOUS
  Filled 2014-09-20: qty 2

## 2014-09-20 MED ORDER — MORPHINE SULFATE 4 MG/ML IJ SOLN
6.0000 mg | Freq: Once | INTRAMUSCULAR | Status: AC
  Administered 2014-09-20: 4 mg via INTRAVENOUS
  Filled 2014-09-20: qty 2

## 2014-09-20 NOTE — ED Notes (Signed)
Initial contact-reporting diffuse abdominal discomfort related to "bloating and gas I just can't release." Symptoms have persisted for "a few weeks." Has tried gas-x with no alleviation of symptoms. Symptoms are constant even if patient hasn't eaten. Reports feeling "ful" and says "my PCP thinks it's my gall bladder." Also c/o sharp right shoulder blade pain radiating to right and mid lower back. No pain medications taken today. Denies cardiac history. Has had chills and nausea but denies vomiting and diarrhea. Saw PCP-said if symptoms worsened to be seen. No other complaints/concerns. Ambulatory and moving all extremities equally. In NAD. Awaiting PA/MD.

## 2014-09-20 NOTE — ED Notes (Signed)
US at bedside

## 2014-09-20 NOTE — ED Notes (Signed)
Per Dr. Juleen ChinaKohut, it will be another 10-15 minutes before DC papers ready Patient made aware

## 2014-09-20 NOTE — ED Notes (Signed)
Pt wanted to refuse vitals because our blood pressure cuff is to tight, I explained to pt that per protocol we neede vitals an hour within discharge, pt accepted vitals

## 2014-09-20 NOTE — Discharge Instructions (Signed)

## 2014-09-25 NOTE — ED Provider Notes (Signed)
CSN: [id number]     Arrival date & time 09/20/14  1503 History   First MD Initiated Contact with Patient 09/20/14 1509     Chief Complaint  Patient presents with  . Abdominal Pain  . Right scapula pain      (Consider location/radiation/quality/duration/timing/severity/associated sxs/prior Treatment) HPI  37yF with abdominal pain. Diffuse cramping. Worse in epigastrium. Feels like she is full of gas. Pain constant. Onset about 2-3 weeks ago. Sometimes worse after eating but not always. Nausea. No vomiting. No fever or chills. No urinary complaints. No diarrhea.   Past Medical History  Diagnosis Date  . Asthma    Past Surgical History  Procedure Laterality Date  . Abdominal hysterectomy    . Tubal ligation     Family History  Problem Relation Age of Onset  . Hypertension Mother   . Diabetes Father   . Diabetes Maternal Grandmother   . Hypertension Maternal Grandmother    History  Substance Use Topics  . Smoking status: Never Smoker   . Smokeless tobacco: Never Used  . Alcohol Use: Yes     Comment: social   OB History   Grav Para Term Preterm Abortions TAB SAB Ect Mult Living   5 3   2  2   3      Review of Systems  All systems reviewed and negative, other than as noted in HPI.   Allergies  Other and Oxycodone-acetaminophen  Home Medications   Prior to Admission medications   Medication Sig Start Date End Date Taking? Authorizing Provider  acetaminophen (TYLENOL) 500 MG tablet Take 1,000 mg by mouth once as needed.   Yes Historical Provider, MD  albuterol (PROAIR HFA) 108 (90 BASE) MCG/ACT inhaler Inhale 2 puffs into the lungs every 6 (six) hours as needed. For wheezing and shortness of breath.   Yes Historical Provider, MD  cetirizine (ZYRTEC) 10 MG tablet Take 10 mg by mouth daily.   Yes Historical Provider, MD  EPINEPHrine (EPIPEN 2-PAK) 0.3 mg/0.3 mL IJ SOAJ injection Inject 0.3 mg into the muscle once as needed (allergic reaction.).   Yes Historical  Provider, MD  fexofenadine (ALLEGRA) 180 MG tablet Take 180 mg by mouth daily.   Yes Historical Provider, MD  simethicone (MYLICON) 80 MG chewable tablet Chew 80 mg by mouth every 6 (six) hours as needed for flatulence.   Yes Historical Provider, MD  pantoprazole (PROTONIX) 20 MG tablet Take 1 tablet (20 mg total) by mouth 2 (two) times daily. 09/20/14   Raeford RazorStephen Chabeli Barsamian, MD   BP 114/67  Pulse 62  Temp(Src) 98.2 F (36.8 C) (Oral)  Resp 18  SpO2 100% Physical Exam  Nursing note and vitals reviewed. Constitutional: She is oriented to person, place, and time. She appears well-developed and well-nourished. No distress.  HENT:  Head: Normocephalic and atraumatic.  Eyes: Conjunctivae are normal. Right eye exhibits no discharge. Left eye exhibits no discharge.  Neck: Neck supple.  Cardiovascular: Normal rate, regular rhythm and normal heart sounds.  Exam reveals no gallop and no friction rub.   No murmur heard. Pulmonary/Chest: Effort normal and breath sounds normal. No respiratory distress.  Abdominal: Soft. She exhibits no distension. There is tenderness. There is no rebound and no guarding.  Mild epigastric/ruq tenderness. No rebound/gaurding. No distension.   Genitourinary:  No cva tenderness  Musculoskeletal: She exhibits no edema and no tenderness.  Neurological: She is alert and oriented to person, place, and time.  Skin: Skin is warm and dry.  Psychiatric: She  has a normal mood and affect. Her behavior is normal. Thought content normal.    ED Course  Procedures (including critical care time) Labs Review Labs Reviewed  COMPREHENSIVE METABOLIC PANEL - Abnormal; Notable for the following:    Total Bilirubin <0.2 (*)    GFR calc non Af Amer 84 (*)    All other components within normal limits  CBC WITH DIFFERENTIAL - Abnormal; Notable for the following:    RBC 3.71 (*)    Hemoglobin 11.2 (*)    HCT 33.5 (*)    Neutrophils Relative % 41 (*)    Lymphocytes Relative 48 (*)    All  other components within normal limits  LIPASE, BLOOD  URINALYSIS, ROUTINE W REFLEX MICROSCOPIC    Imaging Review No results found.   EKG Interpretation None      MDM   Final diagnoses:  Abdominal pain, unspecified abdominal location     37yF with abdominal pain. Only mild tenderness on exam. W/u including RUQ US pretty unremarkable. Very low suspicion for emergent surgical process or other serious etiology. I feel appropriate for DC at this time. Return precautions discussed.     Raeford RazorStephen Vernel Donlan, MD 09/25/14 [phone]

## 2014-10-13 ENCOUNTER — Encounter (HOSPITAL_COMMUNITY): Payer: Self-pay | Admitting: Emergency Medicine

## 2014-10-29 ENCOUNTER — Emergency Department (HOSPITAL_COMMUNITY): Payer: Managed Care, Other (non HMO)

## 2014-10-29 ENCOUNTER — Encounter (HOSPITAL_COMMUNITY): Payer: Self-pay | Admitting: *Deleted

## 2014-10-29 ENCOUNTER — Emergency Department (HOSPITAL_COMMUNITY)
Admission: EM | Admit: 2014-10-29 | Discharge: 2014-10-30 | Disposition: A | Discharge status: Home / Self Care | Payer: Managed Care, Other (non HMO) | Attending: Emergency Medicine | Admitting: Emergency Medicine

## 2014-10-29 DIAGNOSIS — Z79899 Other long term (current) drug therapy: Secondary | ICD-10-CM | POA: Diagnosis not present

## 2014-10-29 DIAGNOSIS — R110 Nausea: Secondary | ICD-10-CM | POA: Insufficient documentation

## 2014-10-29 DIAGNOSIS — R071 Chest pain on breathing: Secondary | ICD-10-CM | POA: Diagnosis not present

## 2014-10-29 DIAGNOSIS — J45901 Unspecified asthma with (acute) exacerbation: Secondary | ICD-10-CM | POA: Diagnosis not present

## 2014-10-29 DIAGNOSIS — M546 Pain in thoracic spine: Secondary | ICD-10-CM | POA: Diagnosis not present

## 2014-10-29 DIAGNOSIS — R109 Unspecified abdominal pain: Secondary | ICD-10-CM

## 2014-10-29 DIAGNOSIS — R079 Chest pain, unspecified: Secondary | ICD-10-CM | POA: Diagnosis present

## 2014-10-29 DIAGNOSIS — R0789 Other chest pain: Secondary | ICD-10-CM

## 2014-10-29 LAB — CBC
HCT: 34.6 % — ABNORMAL LOW (ref 36.0–46.0)
HEMOGLOBIN: 11.5 g/dL — AB (ref 12.0–15.0)
MCH: 29.7 pg (ref 26.0–34.0)
MCHC: 33.2 g/dL (ref 30.0–36.0)
MCV: 89.4 fL (ref 78.0–100.0)
Platelets: 319 10*3/uL (ref 150–400)
RBC: 3.87 MIL/uL (ref 3.87–5.11)
RDW: 13.9 % (ref 11.5–15.5)
WBC: 8.6 10*3/uL (ref 4.0–10.5)

## 2014-10-29 LAB — BASIC METABOLIC PANEL
Anion gap: 10 (ref 5–15)
BUN: 9 mg/dL (ref 6–23)
CHLORIDE: 103 meq/L (ref 96–112)
CO2: 25 mEq/L (ref 19–32)
Calcium: 9.4 mg/dL (ref 8.4–10.5)
Creatinine, Ser: 0.98 mg/dL (ref 0.50–1.10)
GFR calc Af Amer: 84 mL/min — ABNORMAL LOW (ref >90)
GFR calc non Af Amer: 73 mL/min — ABNORMAL LOW (ref >90)
GLUCOSE: 75 mg/dL (ref 70–99)
POTASSIUM: 3.9 meq/L (ref 3.7–5.3)
Sodium: 138 mEq/L (ref 137–147)

## 2014-10-29 LAB — I-STAT TROPONIN, ED: Troponin i, poc: 0.01 ng/mL (ref 0.00–0.08)

## 2014-10-29 MED ORDER — IBUPROFEN 800 MG PO TABS
800.0000 mg | ORAL_TABLET | Freq: Once | ORAL | Status: AC
  Administered 2014-10-29: 800 mg via ORAL
  Filled 2014-10-29: qty 1

## 2014-10-29 NOTE — ED Notes (Signed)
When entering the room, patient was resting quietly, talking on cell phone. Pt is in no respiratory distress.

## 2014-10-29 NOTE — ED Notes (Signed)
Provided patient apple juice

## 2014-10-29 NOTE — ED Notes (Signed)
Patient on stretcher, relaxed, watching television.

## 2014-10-29 NOTE — ED Notes (Signed)
Pt states that she began with rt rib pain that began on Saturday; pt states that the pain is sharp and comes and goes; pt states that she began to have mid sternal chest pain that radiates to both the rt and left chest yesterday; pt states that the pain comes and goes; pt states that she feels like it hard to catch her breath; pt c/o nausea with no vomiting; pt resp evan and unlabored; pt sitting in triage chair listening to music in no acute distress

## 2014-10-29 NOTE — ED Provider Notes (Signed)
CSN: [id number]     Arrival date & time 10/29/14  1928 History   First MD Initiated Contact with Patient 10/29/14 2258     Chief Complaint  Patient presents with  . Chest Pain     (Consider location/radiation/quality/duration/timing/severity/associated sxs/prior Treatment) HPI  Annette Garrett is a 38 year old female with past medical history of asthma who presents the ER with chest pain. Patient states her discomfort began on Saturday, which she states began in her right sided back, patient described the discomfort as spasms in her back which were off and on since Saturday. Patient states since yesterday she began experiencing the same type of pains in her sternum. Patient states the pain is all reproducible with deep inspiration, and is reproducible with when she lies on her left side. Patient states when she begins to feel the pain she expresses mild shortness of breath and some mild nausea. Patient denies having any alleviating factors. Patient denies any associated vomiting, palpitations, lightheadedness, syncope, abdominal pain  Past Medical History  Diagnosis Date  . Asthma    Past Surgical History  Procedure Laterality Date  . Abdominal hysterectomy    . Tubal ligation     Family History  Problem Relation Age of Onset  . Hypertension Mother   . Diabetes Father   . Diabetes Maternal Grandmother   . Hypertension Maternal Grandmother    History  Substance Use Topics  . Smoking status: Never Smoker   . Smokeless tobacco: Never Used  . Alcohol Use: Yes     Comment: social   OB History    Gravida Para Term Preterm AB TAB SAB Ectopic Multiple Living   5 3   2  2   3      Review of Systems  Constitutional: Negative for fever.  HENT: Negative for trouble swallowing.   Eyes: Negative for visual disturbance.  Respiratory: Positive for chest tightness. Negative for cough and shortness of breath.   Cardiovascular: Positive for chest pain. Negative for palpitations.    Gastrointestinal: Positive for nausea. Negative for vomiting and abdominal pain.  Genitourinary: Negative for dysuria.  Musculoskeletal: Negative for neck pain.  Skin: Negative for rash.  Neurological: Negative for dizziness, weakness and numbness.  Psychiatric/Behavioral: Negative.       Allergies  Other and Oxycodone-acetaminophen  Home Medications   Prior to Admission medications   Medication Sig Start Date End Date Taking? Authorizing Provider  albuterol (PROAIR HFA) 108 (90 BASE) MCG/ACT inhaler Inhale 2 puffs into the lungs every 6 (six) hours as needed. For wheezing and shortness of breath.   Yes Historical Provider, MD  cetirizine (ZYRTEC) 10 MG tablet Take 10 mg by mouth daily.   Yes Historical Provider, MD  EPINEPHrine (EPIPEN 2-PAK) 0.3 mg/0.3 mL IJ SOAJ injection Inject 0.3 mg into the muscle once as needed (allergic reaction.).   Yes Historical Provider, MD  fexofenadine (ALLEGRA) 180 MG tablet Take 180 mg by mouth daily.   Yes Historical Provider, MD  pantoprazole (PROTONIX) 20 MG tablet Take 1 tablet (20 mg total) by mouth 2 (two) times daily. 09/20/14  Yes Raeford RazorStephen Kohut, MD  acetaminophen (TYLENOL) 500 MG tablet Take 1,000 mg by mouth every 6 (six) hours as needed for mild pain or headache.     Historical Provider, MD   BP 109/40 mmHg  Pulse 62  Temp(Src) 98.7 F (37.1 C) (Oral)  Resp 14  Ht 5\' 3"  (1.6 m)  Wt 205 lb (92.987 kg)  BMI 36.32 kg/m2  SpO2 100% Physical  Exam  Constitutional: She appears well-developed and well-nourished. No distress.  HENT:  Head: Normocephalic and atraumatic.  Mouth/Throat: Oropharynx is clear and moist. No oropharyngeal exudate.  Eyes: Right eye exhibits no discharge. Left eye exhibits no discharge. No scleral icterus.  Neck: Normal range of motion.  Cardiovascular: Normal rate, regular rhythm, S1 normal, S2 normal and normal heart sounds.   No murmur heard. Pulmonary/Chest: Effort normal and breath sounds normal. No accessory  muscle usage. No tachypnea. No respiratory distress.    Abdominal: Soft. There is no tenderness. There is no rigidity, no guarding, no tenderness at McBurney's point and negative Murphy's sign.  Musculoskeletal: Normal range of motion. She exhibits no edema or tenderness.       Back:  Neurological: She is alert. No cranial nerve deficit. Coordination normal. GCS eye subscore is 4. GCS verbal subscore is 5. GCS motor subscore is 6.  Patient fully alert answering questions appropriately in full, clear sentences. Cranial nerves II through XII grossly intact. Motor strength 5 out of 5 in all major muscle groups of upper and lower extremity. Distal sensation intact.  Skin: Skin is warm and dry. No rash noted. She is not diaphoretic.  Psychiatric: She has a normal mood and affect.  Nursing note and vitals reviewed.   ED Course  Procedures (including critical care time) Labs Review Labs Reviewed  CBC - Abnormal; Notable for the following:    Hemoglobin 11.5 (*)    HCT 34.6 (*)    All other components within normal limits  BASIC METABOLIC PANEL - Abnormal; Notable for the following:    GFR calc non Af Amer 73 (*)    GFR calc Af Amer 84 (*)    All other components within normal limits  COMPREHENSIVE METABOLIC PANEL - Abnormal; Notable for the following:    Total Bilirubin <0.2 (*)    GFR calc non Af Amer 77 (*)    GFR calc Af Amer 89 (*)    All other components within normal limits  I-STAT TROPOININ, ED  Rosezena SensorI-STAT TROPOININ, ED    Imaging Review Dg Chest 2 View  10/29/2014   CLINICAL DATA:  Chest pain.  EXAM: CHEST  2 VIEW  COMPARISON:  August 13, 2014.  FINDINGS: The heart size and mediastinal contours are within normal limits. Both lungs are clear. No pneumothorax or pleural effusion is noted. The visualized skeletal structures are unremarkable.  IMPRESSION: No acute cardiopulmonary abnormality seen.   Electronically Signed   By: Roque LiasJames  Green M.D.   On: 10/29/2014 20:54   [place] Abdomen  Limited Ruq  10/30/2014   CLINICAL DATA:  Right flank pain and chest wall discomfort.  EXAM: US ABDOMEN LIMITED - RIGHT UPPER QUADRANT  COMPARISON:  None.  FINDINGS: Gallbladder:  No gallstones or wall thickening visualized. No sonographic Murphy sign noted.  Common bile duct:  Diameter: 2.9 mm, normal  Liver:  No focal lesion identified. Within normal limits in parenchymal echogenicity.  IMPRESSION: Normal examination.   Electronically Signed   By: Burman NievesWilliam  Stevens M.D.   On: 10/30/2014 02:17     EKG Interpretation None      MDM   Final diagnoses:  Right flank pain  Chest wall discomfort    R sided back "spasms" since Saturday, c/p since yesterday, off and on with sharp pain sternally.  Pain reproducible with lying on L side.  SOB, nausea assoc.  No vomiting, palpitations, light-headedness.  Pertinent negative. HEART score 1 with negative troponin tonight. EKG unremarkable. With  patient's pain described as a "spasming, which comes and goes, increasing in frequency, there is a possibility patient's pain could be biliary colic. I'm not concerned for cardiac or pulmonary events based on patient's labs and presentation at this time. Patient states that her PCP has also suggested that patient may be experiencing biliary colic. We will follow-up with US abdomen complete to assess for gallbladder abnormalities.  Patient signed out to Antony Madura, PA-C with plan to follow-up on ultrasound results. On last assessment, patient was well-appearing, afebrile, non-tachycardic, nontachypneic, non-hypoxic, lying semi-Fowlers on the stretcher comfortably in no acute distress.  Signed,  Ladona Mow, PA-C 2:21 AM    Monte Fantasia, PA-C 10/30/14 0221  Lyanne Co, MD 10/30/14 [phone]

## 2014-10-29 NOTE — ED Notes (Signed)
Provided patient a warm blanket

## 2014-10-29 NOTE — ED Notes (Signed)
When entering the room, patient is watching television and using cell phone. In no respiratory distress.

## 2014-10-30 ENCOUNTER — Emergency Department (HOSPITAL_COMMUNITY): Payer: Managed Care, Other (non HMO)

## 2014-10-30 LAB — COMPREHENSIVE METABOLIC PANEL
ALBUMIN: 3.9 g/dL (ref 3.5–5.2)
ALT: 11 U/L (ref 0–35)
AST: 15 U/L (ref 0–37)
Alkaline Phosphatase: 51 U/L (ref 39–117)
Anion gap: 12 (ref 5–15)
BUN: 9 mg/dL (ref 6–23)
CHLORIDE: 105 meq/L (ref 96–112)
CO2: 24 meq/L (ref 19–32)
Calcium: 9.3 mg/dL (ref 8.4–10.5)
Creatinine, Ser: 0.94 mg/dL (ref 0.50–1.10)
GFR calc Af Amer: 89 mL/min — ABNORMAL LOW (ref >90)
GFR, EST NON AFRICAN AMERICAN: 77 mL/min — AB (ref >90)
Glucose, Bld: 75 mg/dL (ref 70–99)
Potassium: 3.9 mEq/L (ref 3.7–5.3)
SODIUM: 141 meq/L (ref 137–147)
Total Protein: 8 g/dL (ref 6.0–8.3)

## 2014-10-30 MED ORDER — HYDROCODONE-ACETAMINOPHEN 5-325 MG PO TABS
2.0000 | ORAL_TABLET | Freq: Once | ORAL | Status: AC
  Administered 2014-10-30: 2 via ORAL
  Filled 2014-10-30: qty 2

## 2014-10-30 MED ORDER — TRAMADOL HCL 50 MG PO TABS: 50.0000 mg | ORAL_TABLET | Freq: Four times a day (QID) | ORAL | Status: DC | PRN

## 2014-10-30 MED ORDER — PANTOPRAZOLE SODIUM 40 MG PO TBEC
40.0000 mg | DELAYED_RELEASE_TABLET | Freq: Once | ORAL | Status: DC
  Filled 2014-10-30: qty 1

## 2014-10-30 NOTE — ED Notes (Signed)
Ultrasound at bedside

## 2014-10-30 NOTE — ED Notes (Signed)
Patient sleeping when entering the room.

## 2014-10-30 NOTE — Discharge Instructions (Signed)
Costochondritis Costochondritis, sometimes called Tietze syndrome, is a swelling and irritation (inflammation) of the tissue (cartilage) that connects your ribs with your breastbone (sternum). It causes pain in the chest and rib area. Costochondritis usually goes away on its own over time. It can take up to 6 weeks or longer to get better, especially if you are unable to limit your activities. CAUSES  Some cases of costochondritis have no known cause. Possible causes include:  Injury (trauma).  Exercise or activity such as lifting.  Severe coughing. SIGNS AND SYMPTOMS  Pain and tenderness in the chest and rib area.  Pain that gets worse when coughing or taking deep breaths.  Pain that gets worse with specific movements. DIAGNOSIS  Your health care provider will do a physical exam and ask about your symptoms. Chest X-rays or other tests may be done to rule out other problems. TREATMENT  Costochondritis usually goes away on its own over time. Your health care provider may prescribe medicine to help relieve pain. HOME CARE INSTRUCTIONS   Avoid exhausting physical activity. Try not to strain your ribs during normal activity. This would include any activities using chest, abdominal, and side muscles, especially if heavy weights are used.  Apply ice to the affected area for the first 2 days after the pain begins.  Put ice in a plastic bag.  Place a towel between your skin and the bag.  Leave the ice on for 20 minutes, 2-3 times a day.  Only take over-the-counter or prescription medicines as directed by your health care provider. SEEK MEDICAL CARE IF:  You have redness or swelling at the rib joints. These are signs of infection.  Your pain does not go away despite rest or medicine. SEEK IMMEDIATE MEDICAL CARE IF:   Your pain increases or you are very uncomfortable.  You have shortness of breath or difficulty breathing.  You cough up blood.  You have worse chest pains,  sweating, or vomiting.  You have a fever or persistent symptoms for more than 2-3 days.  You have a fever and your symptoms suddenly get worse. MAKE SURE YOU:   Understand these instructions.  Will watch your condition.  Will get help right away if you are not doing well or get worse. Document Released: 09/07/2005 Document Revised: 09/18/2013 Document Reviewed: 07/02/2013 [facility] Patient Information 2015 [place], [place]. This information is not intended to replace advice given to you by your health care provider. Make sure you discuss any questions you have with your health care provider.  Muscle Strain A muscle strain is an injury that occurs when a muscle is stretched beyond its normal length. Usually a small number of muscle fibers are torn when this happens. Muscle strain is rated in degrees. First-degree strains have the least amount of muscle fiber tearing and pain. Second-degree and third-degree strains have increasingly more tearing and pain.  Usually, recovery from muscle strain takes 1-2 weeks. Complete healing takes 5-6 weeks.  CAUSES  Muscle strain happens when a sudden, violent force placed on a muscle stretches it too far. This may occur with lifting, sports, or a fall.  RISK FACTORS Muscle strain is especially common in athletes.  SIGNS AND SYMPTOMS At the site of the muscle strain, there may be:  Pain.  Bruising.  Swelling.  Difficulty using the muscle due to pain or lack of normal function. DIAGNOSIS  Your health care provider will perform a physical exam and ask about your medical history. TREATMENT  Often, the best treatment for  a muscle strain is resting, icing, and applying cold compresses to the injured area.  HOME CARE INSTRUCTIONS   Use the PRICE method of treatment to promote muscle healing during the first 2-3 days after your injury. The PRICE method involves:  Protecting the muscle from being injured again.  Restricting your activity and resting  the injured body part.  Icing your injury. To do this, put ice in a plastic bag. Place a towel between your skin and the bag. Then, apply the ice and leave it on from 15-20 minutes each hour. After the third day, switch to moist heat packs.  Apply compression to the injured area with a splint or elastic bandage. Be careful not to wrap it too tightly. This may interfere with blood circulation or increase swelling.  Elevate the injured body part above the level of your heart as often as you can.  Only take over-the-counter or prescription medicines for pain, discomfort, or fever as directed by your health care provider.  Warming up prior to exercise helps to prevent future muscle strains. SEEK MEDICAL CARE IF:   You have increasing pain or swelling in the injured area.  You have numbness, tingling, or a significant loss of strength in the injured area. MAKE SURE YOU:   Understand these instructions.  Will watch your condition.  Will get help right away if you are not doing well or get worse. Document Released: 11/28/2005 Document Revised: 09/18/2013 Document Reviewed: 06/27/2013 [facility] Patient Information 2015 [place], [place]. This information is not intended to replace advice given to you by your health care provider. Make sure you discuss any questions you have with your health care provider.

## 2014-10-30 NOTE — ED Notes (Signed)
Patient resting quietly, in no distress, watching television.

## 2014-10-30 NOTE — ED Notes (Signed)
Patient eating crackers/peanut butter despite knowing she needs additional testing.

## 2014-10-30 NOTE — ED Notes (Signed)
Patient called out wanting to know how long it would be before ultrasound. In[facility] patient [facility]

## 2014-10-30 NOTE — ED Provider Notes (Signed)
[id number] - Patient care assumed from Ladona MowJoe Mintz, PA-C at shift change. Patient presents to the emergency department for further evaluation of chest pain and pain to her right thoracic back. Patient with ultrasound pending for further evaluation of possible cholelithiasis and biliary colic. Imaging reviewed which shows no evidence of cholelithiasis or cholecystitis. Suspect symptoms to be costochondral/musculoskeletal in nature. Patient to be discharged with pain medication for management and instruction to follow-up with her primary care provider. Return precautions provided. Patient discharged in good condition; VSS.  Filed Vitals:   10/29/14 1949 10/29/14 2141 10/29/14 2259 10/30/14 0030  BP: 119/44 108/46 114/63 109/40  Pulse: 70 68 72 62  Temp: 98.7 F (37.1 C) 98.1 F (36.7 C) 98.7 F (37.1 C)   TempSrc: Oral Oral Oral   Resp: 18 20 20 14   Height: 5\' 3"  (1.6 m)     Weight: 205 lb (92.987 kg)     SpO2: 100% 100% 100% 100%   Results for orders placed or performed during the hospital encounter of 10/29/14  CBC  Result Value Ref Range   WBC 8.6 4.0 - 10.5 K/uL   RBC 3.87 3.87 - 5.11 MIL/uL   Hemoglobin 11.5 (L) 12.0 - 15.0 g/dL   HCT 95.634.6 (L) 21.336.0 - 08.646.0 %   MCV 89.4 78.0 - 100.0 fL   MCH 29.7 26.0 - 34.0 pg   MCHC 33.2 30.0 - 36.0 g/dL   RDW 57.813.9 46.911.5 - 62.915.5 %   Platelets 319 150 - 400 K/uL  Basic metabolic panel  Result Value Ref Range   Sodium 138 137 - 147 mEq/L   Potassium 3.9 3.7 - 5.3 mEq/L   Chloride 103 96 - 112 mEq/L   CO2 25 19 - 32 mEq/L   Glucose, Bld 75 70 - 99 mg/dL   BUN 9 6 - 23 mg/dL   Creatinine, Ser 5.280.98 0.50 - 1.10 mg/dL   Calcium 9.4 8.4 - 41.310.5 mg/dL   GFR calc non Af Amer 73 (L) >90 mL/min   GFR calc Af Amer 84 (L) >90 mL/min   Anion gap 10 5 - 15  Comprehensive metabolic panel  Result Value Ref Range   Sodium 141 137 - 147 mEq/L   Potassium 3.9 3.7 - 5.3 mEq/L   Chloride 105 96 - 112 mEq/L   CO2 24 19 - 32 mEq/L   Glucose, Bld 75 70 - 99 mg/dL   BUN  9 6 - 23 mg/dL   Creatinine, Ser 2.440.94 0.50 - 1.10 mg/dL   Calcium 9.3 8.4 - 01.010.5 mg/dL   Total Protein 8.0 6.0 - 8.3 g/dL   Albumin 3.9 3.5 - 5.2 g/dL   AST 15 0 - 37 U/L   ALT 11 0 - 35 U/L   Alkaline Phosphatase 51 39 - 117 U/L   Total Bilirubin <0.2 (L) 0.3 - 1.2 mg/dL   GFR calc non Af Amer 77 (L) >90 mL/min   GFR calc Af Amer 89 (L) >90 mL/min   Anion gap 12 5 - 15  [facility]stat troponin, ED [facility])  Result Value Ref Range   Troponin i, poc 0.01 0.00 - 0.08 ng/mL   Comment 3           Dg Chest 2 View  10/29/2014   CLINICAL DATA:  Chest pain.  EXAM: CHEST  2 VIEW  COMPARISON:  August 13, 2014.  FINDINGS: The heart size and mediastinal contours are within normal limits. Both lungs are clear. No pneumothorax or  pleural effusion is noted. The visualized skeletal structures are unremarkable.  IMPRESSION: No acute cardiopulmonary abnormality seen.   Electronically Signed   By: Roque LiasJames  Green M.D.   On: 10/29/2014 20:54   [place] Abdomen Limited Ruq  10/30/2014   CLINICAL DATA:  Right flank pain and chest wall discomfort.  EXAM: US ABDOMEN LIMITED - RIGHT UPPER QUADRANT  COMPARISON:  None.  FINDINGS: Gallbladder:  No gallstones or wall thickening visualized. No sonographic Murphy sign noted.  Common bile duct:  Diameter: 2.9 mm, normal  Liver:  No focal lesion identified. Within normal limits in parenchymal echogenicity.  IMPRESSION: Normal examination.   Electronically Signed   By: Burman NievesWilliam  Stevens M.D.   On: 10/30/2014 02:17      Antony MaduraKelly Allisa Einspahr, PA-C 10/30/14 [id number]  Lyanne CoKevin M Campos, MD 10/30/14 [phone]

## 2015-03-03 DIAGNOSIS — E236 Other disorders of pituitary gland: Secondary | ICD-10-CM | POA: Insufficient documentation

## 2015-03-03 DIAGNOSIS — G4489 Other headache syndrome: Secondary | ICD-10-CM | POA: Insufficient documentation

## 2015-03-03 DIAGNOSIS — J4520 Mild intermittent asthma, uncomplicated: Secondary | ICD-10-CM | POA: Insufficient documentation

## 2015-05-06 DIAGNOSIS — G932 Benign intracranial hypertension: Secondary | ICD-10-CM | POA: Insufficient documentation

## 2016-02-21 DIAGNOSIS — G4733 Obstructive sleep apnea (adult) (pediatric): Secondary | ICD-10-CM | POA: Insufficient documentation

## 2016-09-20 DIAGNOSIS — J358 Other chronic diseases of tonsils and adenoids: Secondary | ICD-10-CM | POA: Insufficient documentation

## 2017-04-01 ENCOUNTER — Emergency Department (HOSPITAL_COMMUNITY): Payer: Managed Care, Other (non HMO)

## 2017-04-01 ENCOUNTER — Emergency Department (HOSPITAL_COMMUNITY)
Admission: EM | Admit: 2017-04-01 | Discharge: 2017-04-01 | Disposition: A | Discharge status: Home / Self Care | Payer: Managed Care, Other (non HMO) | Attending: Emergency Medicine | Admitting: Emergency Medicine

## 2017-04-01 ENCOUNTER — Encounter (HOSPITAL_COMMUNITY): Payer: Self-pay | Admitting: Emergency Medicine

## 2017-04-01 DIAGNOSIS — R51 Headache: Secondary | ICD-10-CM | POA: Insufficient documentation

## 2017-04-01 DIAGNOSIS — R519 Headache, unspecified: Secondary | ICD-10-CM

## 2017-04-01 DIAGNOSIS — J45909 Unspecified asthma, uncomplicated: Secondary | ICD-10-CM | POA: Diagnosis not present

## 2017-04-01 DIAGNOSIS — R112 Nausea with vomiting, unspecified: Secondary | ICD-10-CM | POA: Diagnosis not present

## 2017-04-01 HISTORY — DX: Benign intracranial hypertension: G93.2

## 2017-04-01 LAB — BASIC METABOLIC PANEL
ANION GAP: 9 (ref 5–15)
BUN: 9 mg/dL (ref 6–20)
CO2: 20 mmol/L — ABNORMAL LOW (ref 22–32)
Calcium: 8.7 mg/dL — ABNORMAL LOW (ref 8.9–10.3)
Chloride: 107 mmol/L (ref 101–111)
Creatinine, Ser: 0.98 mg/dL (ref 0.44–1.00)
Glucose, Bld: 102 mg/dL — ABNORMAL HIGH (ref 65–99)
POTASSIUM: 3.6 mmol/L (ref 3.5–5.1)
SODIUM: 136 mmol/L (ref 135–145)

## 2017-04-01 LAB — POC URINE PREG, ED: PREG TEST UR: NEGATIVE

## 2017-04-01 LAB — CBC WITH DIFFERENTIAL/PLATELET
BASOS ABS: 0 10*3/uL (ref 0.0–0.1)
Basophils Relative: 0 %
EOS ABS: 0.1 10*3/uL (ref 0.0–0.7)
Eosinophils Relative: 1 %
HCT: 34 % — ABNORMAL LOW (ref 36.0–46.0)
HEMOGLOBIN: 11.5 g/dL — AB (ref 12.0–15.0)
LYMPHS ABS: 2.1 10*3/uL (ref 0.7–4.0)
Lymphocytes Relative: 39 %
MCH: 30.3 pg (ref 26.0–34.0)
MCHC: 33.8 g/dL (ref 30.0–36.0)
MCV: 89.7 fL (ref 78.0–100.0)
Monocytes Absolute: 0.3 10*3/uL (ref 0.1–1.0)
Monocytes Relative: 6 %
Neutro Abs: 3 10*3/uL (ref 1.7–7.7)
Neutrophils Relative %: 54 %
Platelets: 282 10*3/uL (ref 150–400)
RBC: 3.79 MIL/uL — ABNORMAL LOW (ref 3.87–5.11)
RDW: 13.7 % (ref 11.5–15.5)
WBC: 5.5 10*3/uL (ref 4.0–10.5)

## 2017-04-01 LAB — I-STAT TROPONIN, ED: TROPONIN I, POC: 0 ng/mL (ref 0.00–0.08)

## 2017-04-01 LAB — URINALYSIS, ROUTINE W REFLEX MICROSCOPIC
Bilirubin Urine: NEGATIVE
Glucose, UA: NEGATIVE mg/dL
Hgb urine dipstick: NEGATIVE
Ketones, ur: 20 mg/dL — AB
Leukocytes, UA: NEGATIVE
NITRITE: NEGATIVE
PROTEIN: NEGATIVE mg/dL
Specific Gravity, Urine: 1.006 (ref 1.005–1.030)
pH: 6 (ref 5.0–8.0)

## 2017-04-01 LAB — TSH: TSH: 0.191 u[IU]/mL — AB (ref 0.350–4.500)

## 2017-04-01 MED ORDER — KETOROLAC TROMETHAMINE 30 MG/ML IJ SOLN
30.0000 mg | Freq: Once | INTRAMUSCULAR | Status: AC
  Administered 2017-04-01: 30 mg via INTRAVENOUS
  Filled 2017-04-01: qty 1

## 2017-04-01 MED ORDER — ONDANSETRON HCL 4 MG PO TABS: 4.0000 mg | ORAL_TABLET | Freq: Four times a day (QID) | ORAL | 0 refills | Status: DC

## 2017-04-01 MED ORDER — METOCLOPRAMIDE HCL 5 MG/ML IJ SOLN
10.0000 mg | Freq: Once | INTRAMUSCULAR | Status: AC
  Administered 2017-04-01: 10 mg via INTRAVENOUS
  Filled 2017-04-01: qty 2

## 2017-04-01 MED ORDER — DIPHENHYDRAMINE HCL 50 MG/ML IJ SOLN
25.0000 mg | Freq: Once | INTRAMUSCULAR | Status: AC
  Administered 2017-04-01: 25 mg via INTRAVENOUS
  Filled 2017-04-01: qty 1

## 2017-04-01 MED ORDER — SODIUM CHLORIDE 0.9 % IV BOLUS (SEPSIS)
1000.0000 mL | Freq: Once | INTRAVENOUS | Status: AC
  Administered 2017-04-01: 1000 mL via INTRAVENOUS

## 2017-04-01 NOTE — ED Triage Notes (Signed)
Pt in via [facility] with c/o dizziness, tachycardia, HA and generalized abdominal pain. Pt states she had just driven her car to the park and the above symptoms worsened, without activity. Pt called EMS, her HR was 160, BP 182/100, CBG 133. Pt states she smoked marijuana at 1400 this afternoon. Also states she has started a new low carb diet recently

## 2017-04-01 NOTE — ED Provider Notes (Signed)
[facility]-EMERGENCY DEPT Provider Note   CSN: [id number] Arrival date & time: 04/01/17  1610     History   Chief Complaint Chief Complaint  Patient presents with  . Tachycardia  . Dizziness  . Abdominal Pain    HPI Annette Garrett is a 41 y.o. female.  The history is provided by the patient.  Illness  This is a new problem. The current episode started yesterday. The problem has been gradually improving. Associated symptoms include headaches and shortness of breath. Pertinent negatives include no chest pain and no abdominal pain. Nothing aggravates the symptoms. Nothing relieves the symptoms.    Past Medical History:  Diagnosis Date  . Asthma   . Intracranial hypertension     Patient Active Problem List   Diagnosis Date Noted  . GERD 08/21/2008  . CHEST PAIN 08/21/2008    Past Surgical History:  Procedure Laterality Date  . ABDOMINAL HYSTERECTOMY    . TUBAL LIGATION      OB History    Gravida Para Term Preterm AB Living   SAB TAB Ectopic Multiple Live Births   2               Home Medications    Prior to Admission medications   Medication Sig Start Date End Date Taking? Authorizing Provider  acetaminophen (TYLENOL) 500 MG tablet Take 1,000 mg by mouth every 6 (six) hours as needed for mild pain or headache.     Historical Provider, MD  albuterol (PROAIR HFA) 108 (90 BASE) MCG/ACT inhaler Inhale 2 puffs into the lungs every 6 (six) hours as needed. For wheezing and shortness of breath.    Historical Provider, MD  cetirizine (ZYRTEC) 10 MG tablet Take 10 mg by mouth daily.    Historical Provider, MD  EPINEPHrine (EPIPEN 2-PAK) 0.3 mg/0.3 mL IJ SOAJ injection Inject 0.3 mg into the muscle once as needed (allergic reaction.).    Historical Provider, MD  fexofenadine (ALLEGRA) 180 MG tablet Take 180 mg by mouth daily.    Historical Provider, MD  pantoprazole (PROTONIX) 20 MG tablet Take 1 tablet (20 mg total) by mouth 2 (two) times daily. 09/20/14    [name], MD  traMADol (ULTRAM) 50 MG tablet Take 1 tablet (50 mg total) by mouth every 6 (six) hours as needed. 10/30/14   Antony Madura, PA-C    Family History Family History  Problem Relation Age of Onset  . Hypertension Mother   . Diabetes Father   . Diabetes Maternal Grandmother   . Hypertension Maternal Grandmother     Social History Social History  Substance Use Topics  . Smoking status: Never Smoker  . Smokeless tobacco: Never Used  . Alcohol use Yes     Comment: social     Allergies   Other and Oxycodone-acetaminophen   Review of Systems Review of Systems  Constitutional: Negative for chills and fever.  HENT: Negative for ear pain and sore throat.   Eyes: Negative for pain and visual disturbance.  Respiratory: Positive for shortness of breath. Negative for cough.   Cardiovascular: Negative for chest pain and palpitations.  Gastrointestinal: Positive for nausea and vomiting. Negative for abdominal pain.  Genitourinary: Positive for pelvic pain ("ovary pain"). Negative for dysuria and hematuria.  Musculoskeletal: Negative for arthralgias and back pain.  Skin: Negative for color change and rash.  Neurological: Positive for headaches. Negative for seizures and syncope.  All other systems reviewed and are negative.  Physical Exam Updated Vital Signs Wt 93 kg   SpO2 100%   BMI 36.31 kg/m   Physical Exam  Constitutional: She appears well-developed and well-nourished. No distress.  HENT:  Head: Normocephalic and atraumatic.  Eyes: Conjunctivae are normal.  Neck: Neck supple.  Cardiovascular: Normal rate and regular rhythm.   No murmur heard. Pulmonary/Chest: Effort normal and breath sounds normal. No respiratory distress.  Abdominal: Soft. She exhibits no distension. There is no tenderness. There is no guarding.  Musculoskeletal: She exhibits no edema.  Neurological: She is alert.  Skin: Skin is warm and dry.  Psychiatric: She has a normal mood and  affect.  Nursing note and vitals reviewed.    ED Treatments / Results  Labs (all labs ordered are listed, but only abnormal results are displayed) Labs Reviewed  CBC WITH DIFFERENTIAL/PLATELET  BASIC METABOLIC PANEL  TSH  POC URINE PREG, ED    EKG  EKG Interpretation  Date/Time:  Saturday April 01 2017 16:17:03 EDT Ventricular Rate:  95 PR Interval:    QRS Duration: 85 QT Interval:  356 QTC Calculation: 448 R Axis:   52 Text Interpretation:  Sinus rhythm Confirmed by Jodi Mourning MD, Ivin Booty ([id number]) on 04/01/2017 4:24:33 PM       Radiology No results found.  Procedures Procedures (including critical care time)  Medications Ordered in ED Medications  sodium chloride 0.9 % bolus 1,000 mL (not administered)  metoCLOPramide (REGLAN) injection 10 mg (not administered)     Initial Impression / Assessment and Plan / ED Course  I have reviewed the triage vital signs and the nursing notes.  Pertinent labs & imaging results that were available during my care of the patient were reviewed by me and considered in my medical decision making (see chart for details).    Pt with h/o IIH, GERD, marijuana use presents with multiple complaints. Says she has been having "ovary pain" intermittently for the last month & yesterday she had some N/V that improved w/o intervention. Today, she started to feel N/V again w/a HA, smoked some marijuana, & then started having some SOB & a sensation of "not feeling like myself" so she called 911. Denies F/C, cough, CP, urinary symptoms, recent travel, or recent illness. Works as a Engineer, site.  VS & exam as above. EKG: NSR @ 95bpm w/o signs of ischemia. Low risk HEAR score. Low risk per [facility]. CXR WNL. Labs remarkable for TSH 0.191 & undetectable troponin. Migraine cocktail given for HA w/significant relief of symptoms.  Explained all results to the Pt. Will discharge the Pt home w/prescription for Zofran. Recommending follow-up with PCP re: TSH  result. ED return precautions provided. Pt acknowledged understanding of, and concurrence with the plan. All questions answered to her satisfaction. In stable condition at the time of discharge.  Final Clinical Impressions(s) / ED Diagnoses   Final diagnoses:  Acute nonintractable headache, unspecified headache type  Non-intractable vomiting with nausea, unspecified vomiting type    New Prescriptions New Prescriptions   ONDANSETRON (ZOFRAN) 4 MG TABLET    Take 1 tablet (4 mg total) by mouth every 6 (six) hours.     Forest Becker, MD 04/02/17 6045    Blane Ohara, MD 04/02/17 [phone]

## 2019-06-27 ENCOUNTER — Other Ambulatory Visit: Payer: Self-pay | Admitting: Family Medicine

## 2019-06-27 DIAGNOSIS — Z20822 Contact with and (suspected) exposure to covid-19: Secondary | ICD-10-CM

## 2019-07-02 LAB — NOVEL CORONAVIRUS, NAA: SARS-CoV-2, NAA: DETECTED — AB

## 2020-01-17 ENCOUNTER — Ambulatory Visit
Admission: EM | Admit: 2020-01-17 | Discharge: 2020-01-17 | Disposition: A | Discharge status: Home / Self Care | Payer: 59 | Attending: Physician Assistant | Admitting: Physician Assistant

## 2020-01-17 ENCOUNTER — Encounter: Payer: Self-pay | Admitting: Emergency Medicine

## 2020-01-17 ENCOUNTER — Other Ambulatory Visit: Payer: Self-pay

## 2020-01-17 DIAGNOSIS — N898 Other specified noninflammatory disorders of vagina: Secondary | ICD-10-CM

## 2020-01-17 LAB — POCT URINALYSIS DIP (MANUAL ENTRY)
Bilirubin, UA: NEGATIVE
Blood, UA: NEGATIVE
Glucose, UA: NEGATIVE mg/dL
Ketones, POC UA: NEGATIVE mg/dL
Leukocytes, UA: NEGATIVE
Nitrite, UA: NEGATIVE
Protein Ur, POC: NEGATIVE mg/dL
Spec Grav, UA: 1.015 (ref 1.010–1.025)
Urobilinogen, UA: 0.2 E.U./dL
pH, UA: 6 (ref 5.0–8.0)

## 2020-01-17 MED ORDER — METRONIDAZOLE 0.75 % VA GEL: 1.0000 | Freq: Every day | VAGINAL | 0 refills | Status: AC

## 2020-01-17 NOTE — ED Triage Notes (Signed)
Pt presents to [facility] for assessment of vaginal mal-odor only during the act of having sex with her partner.  Hx of BV but states she is not having discharge.   Denies pain during intercourse.

## 2020-01-17 NOTE — ED Provider Notes (Signed)
[name]    CSN: [id number] Arrival date & time: 01/17/20  1502      History   Chief Complaint Chief Complaint  Patient presents with  . Vaginal Discharge    HPI Annette Garrett is a 44 y.o. female.   44 year old female come in for few week history of vaginal odor during intercourse. Denies vaginal discharge, itching, spotting.  Denies abdominal pain, nausea, vomiting, diarrhea.  Denies fever, chills,\flank pain.  Denies urinary symptoms such as frequency, dysuria, hematuria.  However, she did state that she had some urinary symptoms with right flank pain a few weeks ago, and vaginal odor started shortly after.  Sexually active with one female partner, no condom use.  No obvious changes in hygiene product.  Status post hysterectomy.     Past Medical History:  Diagnosis Date  . Asthma   . Intracranial hypertension     Patient Active Problem List   Diagnosis Date Noted  . GERD 08/21/2008  . CHEST PAIN 08/21/2008    Past Surgical History:  Procedure Laterality Date  . ABDOMINAL HYSTERECTOMY    . TUBAL LIGATION      OB History    Gravida  5   Para  3   Term      Preterm      AB  2   Living  3     SAB  2   TAB      Ectopic      Multiple      Live Births               Home Medications    Prior to Admission medications   Medication Sig Start Date End Date Taking? Authorizing Provider  baclofen (LIORESAL) 10 MG tablet Take 10 mg by mouth 3 (three) times daily.   Yes [provider]  cyclobenzaprine (FLEXERIL) 10 MG tablet Take 10 mg by mouth 3 (three) times daily as needed for muscle spasms.   Yes [provider]  acetaminophen (TYLENOL) 500 MG tablet Take 1,000 mg by mouth every 6 (six) hours as needed for mild pain or headache.     [provider]  albuterol (PROAIR HFA) 108 (90 BASE) MCG/ACT inhaler Inhale 2 puffs into the lungs every 6 (six) hours as needed. For wheezing and shortness of breath.     [provider]  cetirizine (ZYRTEC) 10 MG tablet Take 10 mg by mouth daily.    [provider]  EPINEPHrine (EPIPEN 2-PAK) 0.3 mg/0.3 mL IJ SOAJ injection Inject 0.3 mg into the muscle once as needed (allergic reaction.).    [provider]  furosemide (LASIX) 40 MG tablet Take 40 mg by mouth daily.    [provider]  metroNIDAZOLE (METROGEL VAGINAL) 0.75 % vaginal gel Place 1 Applicatorful vaginally at bedtime for 5 days. 01/17/20 01/22/20  Tasia Catchings, Carrie Usery V, PA-C  pantoprazole (PROTONIX) 20 MG tablet Take 1 tablet (20 mg total) by mouth 2 (two) times daily. Patient taking differently: Take 20 mg by mouth daily as needed for heartburn.  09/20/14   Virgel Manifold, MD  topiramate (TOPAMAX) 100 MG tablet Take 100 mg by mouth daily.  01/17/20  [provider]    Family History Family History  Problem Relation Age of Onset  . Hypertension Mother   . Diabetes Father   . Diabetes Maternal Grandmother   . Hypertension Maternal Grandmother     Social History Social History   Tobacco Use  . Smoking  status: Never Smoker  . Smokeless tobacco: Never Used  Substance Use Topics  . Alcohol use: Yes    Comment: social  . Drug use: No     Allergies   Other and Oxycodone-acetaminophen   Review of Systems Review of Systems  Reason unable to perform ROS: See HPI as above.     Physical Exam Triage Vital Signs ED Triage Vitals  Enc Vitals Group     BP 01/17/20 1510 105/70     Pulse Rate 01/17/20 1510 79     Resp 01/17/20 1510 18     Temp 01/17/20 1510 97.8 F (36.6 C)     Temp Source 01/17/20 1510 Temporal     SpO2 01/17/20 1510 97 %     Weight --      Height --      Head Circumference --      Peak Flow --      Pain Score 01/17/20 1512 0     Pain Loc --      Pain Edu? --      Excl. in GC? --    No data found.  Updated Vital Signs BP 105/70 (BP Location: Left Arm)   Pulse 79   Temp 97.8 F (36.6 C) (Temporal)   Resp 18   SpO2 97%    Physical Exam Constitutional:      General: She is not in acute distress.    Appearance: She is well-developed. She is not ill-appearing, toxic-appearing or diaphoretic.  HENT:     Head: Normocephalic and atraumatic.  Eyes:     Conjunctiva/sclera: Conjunctivae normal.     Pupils: Pupils are equal, round, and reactive to light.  Cardiovascular:     Rate and Rhythm: Normal rate and regular rhythm.  Pulmonary:     Effort: Pulmonary effort is normal. No respiratory distress.     Comments: LCTAB Abdominal:     General: Bowel sounds are normal.     Palpations: Abdomen is soft.     Tenderness: There is no abdominal tenderness. There is no right CVA tenderness, left CVA tenderness, guarding or rebound.  Musculoskeletal:     Cervical back: Normal range of motion and neck supple.  Skin:    General: Skin is warm and dry.  Neurological:     Mental Status: She is alert and oriented to person, place, and time.  Psychiatric:        Behavior: Behavior normal.        Judgment: Judgment normal.      UC Treatments / Results  Labs (all labs ordered are listed, but only abnormal results are displayed) Labs Reviewed  POCT URINALYSIS DIP (MANUAL ENTRY)  CERVICOVAGINAL ANCILLARY ONLY    EKG   Radiology No results found.  Procedures Procedures (including critical care time)  Medications Ordered in UC Medications - No data to display  Initial Impression / Assessment and Plan / UC Course  I have reviewed the triage vital signs and the nursing notes.  Pertinent labs & imaging results that were available during my care of the patient were reviewed by me and considered in my medical decision making (see chart for details).    Urine dipstick negative. Patient was treated empirically for BV with metrogel. Cytology sent, patient will be contacted with any positive results that require additional treatment. Return precautions given.   Final Clinical Impressions(s) / UC Diagnoses    Final diagnoses:  Vaginal odor   ED Prescriptions    Medication Sig Dispense  Auth. Provider   metroNIDAZOLE (METROGEL VAGINAL) 0.75 % vaginal gel Place 1 Applicatorful vaginally at bedtime for 5 days. 50 g Belinda Fisher, PA-C     PDMP not reviewed this encounter.   Belinda Fisher, PA-C 01/17/20 1544

## 2020-01-17 NOTE — Discharge Instructions (Signed)
Urine negative for infection. Metrogel as directed to cover for BV. Cytology sent, you will be contacted with any positive results that requires further treatment. Monitor for any worsening of symptoms, fever, abdominal pain, nausea, vomiting, to follow up for reevaluation.

## 2020-01-17 NOTE — ED Notes (Signed)
Patient able to ambulate independently  

## 2020-01-22 LAB — CERVICOVAGINAL ANCILLARY ONLY
Bacterial vaginitis: POSITIVE — AB
Candida vaginitis: NEGATIVE
Chlamydia: NEGATIVE
Neisseria Gonorrhea: NEGATIVE
Trichomonas: NEGATIVE

## 2020-01-24 ENCOUNTER — Other Ambulatory Visit: Payer: Self-pay

## 2020-01-24 ENCOUNTER — Encounter (HOSPITAL_COMMUNITY): Payer: Self-pay

## 2020-01-24 ENCOUNTER — Ambulatory Visit (HOSPITAL_COMMUNITY)
Admission: EM | Admit: 2020-01-24 | Discharge: 2020-01-24 | Disposition: A | Discharge status: Home / Self Care | Payer: 59 | Attending: Family Medicine | Admitting: Family Medicine

## 2020-01-24 DIAGNOSIS — K5900 Constipation, unspecified: Secondary | ICD-10-CM

## 2020-01-24 DIAGNOSIS — K625 Hemorrhage of anus and rectum: Secondary | ICD-10-CM

## 2020-01-24 LAB — POC OCCULT BLOOD, ED: Fecal Occult Bld: NEGATIVE

## 2020-01-24 LAB — OCCULT BLOOD, POC DEVICE: Fecal Occult Bld: NEGATIVE

## 2020-01-24 MED ORDER — HYDROCORTISONE ACETATE 25 MG RE SUPP: 25.0000 mg | Freq: Two times a day (BID) | RECTAL | 0 refills | Status: DC

## 2020-01-24 MED ORDER — POLYETHYLENE GLYCOL 3350 17 G PO PACK: 17.0000 g | PACK | Freq: Every day | ORAL | 0 refills | Status: DC

## 2020-01-24 NOTE — Discharge Instructions (Addendum)
Your Hemoccult stool was negative Medication as prescribed.  Follow up as needed for continued or worsening symptoms

## 2020-01-24 NOTE — ED Triage Notes (Signed)
Pt is here with rectal bleeding that started 2 days ago, pt has NOT taken any meds to relieve discomfort.

## 2020-01-26 NOTE — ED Provider Notes (Signed)
[facility]    CSN: [id number] Arrival date & time: 01/24/20  1228      History   Chief Complaint Chief Complaint  Patient presents with  . Rectal Bleeding    HPI Annette Garrett is a 44 y.o. female.   Patient is a 59 female presents today with rectal discomfort, bleeding and constipation.  This is been intermittent over the past couple days.  Has suffered from hemorrhoids in the past.  Currently has not take any medication for symptoms.  Denies any abdominal pain, nausea or vomiting.  Reporting the bleeding is bright red and mostly with wiping after bowel movement.  ROS per HPI      Past Medical History:  Diagnosis Date  . Asthma   . Intracranial hypertension     Patient Active Problem List   Diagnosis Date Noted  . GERD 08/21/2008  . CHEST PAIN 08/21/2008    Past Surgical History:  Procedure Laterality Date  . ABDOMINAL HYSTERECTOMY    . TUBAL LIGATION      OB History    Gravida  5   Para  3   Term      Preterm      AB  2   Living  3     SAB  2   TAB      Ectopic      Multiple      Live Births               Home Medications    Prior to Admission medications   Medication Sig Start Date End Date Taking? Authorizing Provider  albuterol (VENTOLIN HFA) 108 (90 Base) MCG/ACT inhaler Inhale into the lungs. 02/11/19  Yes [provider]  cetirizine (ZYRTEC) 10 MG tablet Take by mouth. 02/11/19  Yes [provider]  furosemide (LASIX) 40 MG tablet Take by mouth. 10/15/19 10/14/20 Yes [provider]  ibuprofen (ADVIL) 800 MG tablet Take by mouth. 04/13/18  Yes [provider]  pantoprazole (PROTONIX) 20 MG tablet Take by mouth. 09/10/17  Yes [provider]  acetaminophen (TYLENOL) 500 MG tablet Take 1,000 mg by mouth every 6 (six) hours as needed for mild pain or headache.     [provider]  albuterol (PROAIR HFA) 108 (90 BASE) MCG/ACT inhaler Inhale 2 puffs into the lungs every 6  (six) hours as needed. For wheezing and shortness of breath.    [provider]  albuterol (VENTOLIN HFA) 108 (90 Base) MCG/ACT inhaler Inhale into the lungs.    [provider]  baclofen (LIORESAL) 10 MG tablet Take 10 mg by mouth 3 (three) times daily.    [provider]  cetirizine (ZYRTEC) 10 MG tablet Take 10 mg by mouth daily.    [provider]  cyclobenzaprine (FLEXERIL) 10 MG tablet Take 10 mg by mouth 3 (three) times daily as needed for muscle spasms.    [provider]  EPINEPHrine (EPIPEN 2-PAK) 0.3 mg/0.3 mL IJ SOAJ injection Inject 0.3 mg into the muscle once as needed (allergic reaction.).    [provider]  EPINEPHrine 0.3 mg/0.3 mL IJ SOAJ injection Inject into the muscle.    [provider]  furosemide (LASIX) 40 MG tablet Take 40 mg by mouth daily.    [provider]  hydrocortisone (ANUSOL-HC) 25 MG suppository Place 1 suppository (25 mg total) rectally 2 (two) times daily. 01/24/20   Michelene Keniston, Gloris Manchester A, NP  montelukast (SINGULAIR) 10 MG tablet Take by mouth.  [provider]  pantoprazole (PROTONIX) 20 MG tablet Take 1 tablet (20 mg total) by mouth 2 (two) times daily. Patient taking differently: Take 20 mg by mouth daily as needed for heartburn.  09/20/14   [name], MD  polyethylene glycol (MIRALAX / GLYCOLAX) 17 g packet Take 17 g by mouth daily. 01/24/20   [name], NP  topiramate (TOPAMAX) 100 MG tablet Take by mouth.    [provider]  topiramate (TOPAMAX) 50 MG tablet Take by mouth.    [provider]    Family History Family History  Problem Relation Age of Onset  . Hypertension Mother   . Diabetes Father   . Diabetes Maternal Grandmother   . Hypertension Maternal Grandmother     Social History Social History   Tobacco Use  . Smoking status: Never Smoker  . Smokeless tobacco: Never Used  Substance Use Topics  . Alcohol use: Yes    Comment: social    . Drug use: No     Allergies   Other, Oxycodone-acetaminophen, and Oxycodone-acetaminophen   Review of Systems Review of Systems   Physical Exam Triage Vital Signs ED Triage Vitals  Enc Vitals Group     BP 01/24/20 1325 (!) 114/59     Pulse Rate 01/24/20 1325 94     Resp 01/24/20 1325 18     Temp 01/24/20 1325 99.1 F (37.3 C)     Temp Source 01/24/20 1325 Oral     SpO2 01/24/20 1325 97 %     Weight --      Height --      Head Circumference --      Peak Flow --      Pain Score 01/24/20 1321 3     Pain Loc --      Pain Edu? --      Excl. in GC? --    No data found.  Updated Vital Signs BP (!) 114/59 (BP Location: Right Arm)   Pulse 94   Temp 99.1 F (37.3 C) (Oral)   Resp 18   SpO2 97%   Visual Acuity Right Eye Distance:   Left Eye Distance:   Bilateral Distance:    Right Eye Near:   Left Eye Near:    Bilateral Near:     Physical Exam Vitals and nursing note reviewed.  Constitutional:      General: She is not in acute distress.    Appearance: Normal appearance. She is not ill-appearing, toxic-appearing or diaphoretic.  HENT:     Head: Normocephalic.     Nose: Nose normal.  Eyes:     Conjunctiva/sclera: Conjunctivae normal.  Pulmonary:     Effort: Pulmonary effort is normal.  Genitourinary:    Comments: Old skin tags noted from previous hemorrhoids. No thrombosed hemorrhoid or bleeding Small abnormality felt inside rectal area and hard stool felt Musculoskeletal:        General: Normal range of motion.     Cervical back: Normal range of motion.  Skin:    General: Skin is warm and dry.     Findings: No rash.  Neurological:     Mental Status: She is alert.  Psychiatric:        Mood and Affect: Mood normal.      UC Treatments / Results  Labs (all labs ordered are listed, but only abnormal results are displayed) Labs Reviewed  POC OCCULT BLOOD, ED  OCCULT BLOOD, POC DEVICE    EKG   Radiology  No results  found.  Procedures Procedures (including critical care time)  Medications Ordered in UC Medications - No data to display  Initial Impression / Assessment and Plan / UC Course  I have reviewed the triage vital signs and the nursing notes.  Pertinent labs & imaging results that were available during my care of the patient were reviewed by me and considered in my medical decision making (see chart for details).    Constipation or rectal bleeding No obvious blood or hemorrhoids on exam. Palpated hard stool in rectum Hemoccult stool here negative We will prescribe her hydrocortisone suppositories and have her do MiraLAX for constipation Follow up as needed for continued or worsening symptoms  Final Clinical Impressions(s) / UC Diagnoses   Final diagnoses:  Constipation, unspecified constipation type  Rectal bleeding     Discharge Instructions     Your Hemoccult stool was negative Medication as prescribed.  Follow up as needed for continued or worsening symptoms      ED Prescriptions    Medication Sig Dispense Auth. Provider   hydrocortisone (ANUSOL-HC) 25 MG suppository Place 1 suppository (25 mg total) rectally 2 (two) times daily. 12 suppository Torri Michalski A, NP   polyethylene glycol (MIRALAX / GLYCOLAX) 17 g packet Take 17 g by mouth daily. 14 each Orvan July, NP     PDMP not reviewed this encounter.   Loura Halt A, NP 01/26/20 1120

## 2020-08-21 ENCOUNTER — Ambulatory Visit
Admission: EM | Admit: 2020-08-21 | Discharge: 2020-08-21 | Disposition: A | Discharge status: Home / Self Care | Payer: Worker's Compensation | Attending: Emergency Medicine | Admitting: Emergency Medicine

## 2020-08-21 ENCOUNTER — Other Ambulatory Visit: Payer: Self-pay

## 2020-08-21 DIAGNOSIS — S46811A Strain of other muscles, fascia and tendons at shoulder and upper arm level, right arm, initial encounter: Secondary | ICD-10-CM

## 2020-08-21 MED ORDER — NAPROXEN 500 MG PO TABS: 500.0000 mg | ORAL_TABLET | Freq: Two times a day (BID) | ORAL | 0 refills | Status: DC

## 2020-08-21 MED ORDER — CYCLOBENZAPRINE HCL 10 MG PO TABS: 10.0000 mg | ORAL_TABLET | Freq: Three times a day (TID) | ORAL | 0 refills | Status: AC | PRN

## 2020-08-21 NOTE — ED Provider Notes (Signed)
[name]    CSN: [id number] Arrival date & time: 08/21/20  1859      History   Chief Complaint Chief Complaint  Patient presents with  . Shoulder Pain    injured shoulder last night    HPI Annette Garrett is a 44 y.o. female  Presenting for evaluation of right trapezius pain and swelling.  Patient states that she was pulling trays at work when she felt a pulling sensation.  Has had limited ROM of shoulder and neck due to this.  No fever, upper extremity numbness or weakness, bruising.  Past Medical History:  Diagnosis Date  . Asthma   . Intracranial hypertension     Patient Active Problem List   Diagnosis Date Noted  . GERD 08/21/2008  . CHEST PAIN 08/21/2008    Past Surgical History:  Procedure Laterality Date  . ABDOMINAL HYSTERECTOMY    . TUBAL LIGATION      OB History    Gravida  5   Para  3   Term      Preterm      AB  2   Living  3     SAB  2   TAB      Ectopic      Multiple      Live Births               Home Medications    Prior to Admission medications   Medication Sig Start Date End Date Taking? Authorizing Provider  acetaminophen (TYLENOL) 500 MG tablet Take 1,000 mg by mouth every 6 (six) hours as needed for mild pain or headache.     [provider]  albuterol (PROAIR HFA) 108 (90 BASE) MCG/ACT inhaler Inhale 2 puffs into the lungs every 6 (six) hours as needed. For wheezing and shortness of breath.    [provider]  albuterol (VENTOLIN HFA) 108 (90 Base) MCG/ACT inhaler Inhale into the lungs.    [provider]  albuterol (VENTOLIN HFA) 108 (90 Base) MCG/ACT inhaler Inhale into the lungs. 02/11/19   [provider]  baclofen (LIORESAL) 10 MG tablet Take 10 mg by mouth 3 (three) times daily.    [provider]  cetirizine (ZYRTEC) 10 MG tablet Take 10 mg by mouth daily.    [provider]  cetirizine (ZYRTEC) 10 MG tablet Take by mouth. 02/11/19   [provider]  cyclobenzaprine (FLEXERIL) 10 MG tablet Take 1 tablet (10 mg total) by mouth 3 (three) times daily as needed for muscle spasms. 08/21/20   Hall-Potvin, [place], PA-C  EPINEPHrine (EPIPEN 2-PAK) 0.3 mg/0.3 mL IJ SOAJ injection Inject 0.3 mg into the muscle once as needed (allergic reaction.).    [provider]  EPINEPHrine 0.3 mg/0.3 mL IJ SOAJ injection Inject into the muscle.    [provider]  furosemide (LASIX) 40 MG tablet Take 40 mg by mouth daily.    [provider]  furosemide (LASIX) 40 MG tablet Take by mouth. 10/15/19 10/14/20  [provider]  hydrocortisone (ANUSOL-HC) 25 MG suppository Place 1 suppository (25 mg total) rectally 2 (two) times daily. 01/24/20   [name], NP  ibuprofen (ADVIL) 800 MG tablet Take by mouth. 04/13/18   [provider]  montelukast (SINGULAIR) 10 MG tablet Take by mouth.    [provider]  naproxen (NAPROSYN) 500 MG tablet Take 1 tablet (500 mg total) by mouth 2 (two) times daily. 08/21/20   Hall-Potvin, [place], PA-C  pantoprazole (PROTONIX) 20 MG tablet Take 1 tablet (20 mg total) by mouth 2 (two) times daily. Patient taking differently: Take 20 mg by mouth daily as needed for heartburn.  09/20/14   [name], MD  pantoprazole (PROTONIX) 20 MG tablet Take by mouth. 09/10/17   [provider]  polyethylene glycol (MIRALAX / GLYCOLAX) 17 g packet Take 17 g by mouth daily. 01/24/20   [name], NP  topiramate (TOPAMAX) 100 MG tablet Take by mouth.    [provider]  topiramate (TOPAMAX) 50 MG tablet Take by mouth.    [provider]    Family History Family History  Problem Relation Age of Onset  . Hypertension Mother   . Diabetes Father   . Diabetes Maternal Grandmother   . Hypertension Maternal Grandmother     Social History Social History   Tobacco Use  . Smoking status: Never Smoker  . Smokeless tobacco: Never Used  Substance Use  Topics  . Alcohol use: Yes    Comment: social  . Drug use: No     Allergies   Other, Oxycodone-acetaminophen, and Oxycodone-acetaminophen   Review of Systems As per HPI   Physical Exam Triage Vital Signs ED Triage Vitals  Enc Vitals Group     BP 08/21/20 1940 116/82     Pulse Rate 08/21/20 1940 68     Resp 08/21/20 1940 18     Temp 08/21/20 1940 98.4 F (36.9 C)     Temp Source 08/21/20 1940 Oral     SpO2 08/21/20 1940 98 %     Weight --      Height --      Head Circumference --      Peak Flow --      Pain Score 08/21/20 1942 4     Pain Loc --      Pain Edu? --      Excl. in GC? --    No data found.  Updated Vital Signs BP 116/82 (BP Location: Left Arm)   Pulse 68   Temp 98.4 F (36.9 C) (Oral)   Resp 18   SpO2 98%   Visual Acuity Right Eye Distance:   Left Eye Distance:   Bilateral Distance:    Right Eye Near:   Left Eye Near:    Bilateral Near:     Physical Exam Constitutional:      General: She is not in acute distress. HENT:     Head: Normocephalic and atraumatic.  Eyes:     General: No scleral icterus.    Pupils: Pupils are equal, round, and reactive to light.  Cardiovascular:     Rate and Rhythm: Normal rate.  Pulmonary:     Effort: Pulmonary effort is normal.  Musculoskeletal:     Comments: Right trapezius, superior aspect, with swelling as compared to left.  No crepitus, deformity, spasm.  No scapular or AC joint tenderness, bony deformity.  Patient has full active ROM of shoulder, though does endorse pain with full abduction.  Neurovascular intact bilaterally.  Skin:    Coloration: Skin is not jaundiced or pale.  Neurological:     Mental Status: She is alert and oriented to person, place, and time.      UC Treatments / Results  Labs (all labs ordered are listed, but only abnormal results are displayed) Labs Reviewed - No data to display  EKG   Radiology No results found.  Procedures Procedures (including critical care  time)  Medications Ordered  in UC Medications - No data to display  Initial Impression / Assessment and Plan / UC Course  I have reviewed the triage vital signs and the nursing notes.  Pertinent labs & imaging results that were available during my care of the patient were reviewed by me and considered in my medical decision making (see chart for details).     MSK strain.  No indication for x-ray at this time.  Will treat supportively as outlined below, follow-up with occupational health and Ortho as needed.  Return precautions discussed, pt verbalized understanding and is agreeable to plan. Final Clinical Impressions(s) / UC Diagnoses   Final diagnoses:  Trapezius strain, right, initial encounter     Discharge Instructions     Cold therapy (ice packs) can be used to help swelling both after injury and after prolonged use of areas of chronic pain/aches.  Pain medication:  500 mg Naprosyn/Aleve (naproxen) every 12 hours with food:  AVOID other NSAIDs while taking this (may have Tylenol).  May take muscle relaxer as needed for severe pain / spasm.  (This medication may cause you to become tired so it is important you do not drink alcohol or operate heavy machinery while on this medication.  Recommend your first dose to be taken before bedtime to monitor for side effects safely)  Important to follow up with specialist(s) below for further evaluation/management if your symptoms persist or worsen.    ED Prescriptions    Medication Sig Dispense Auth. Provider   naproxen (NAPROSYN) 500 MG tablet Take 1 tablet (500 mg total) by mouth 2 (two) times daily. 30 tablet Hall-Potvin, [place], PA-C   cyclobenzaprine (FLEXERIL) 10 MG tablet Take 1 tablet (10 mg total) by mouth 3 (three) times daily as needed for muscle spasms. 30 tablet Hall-Potvin, [place], PA-C     I have reviewed the PDMP during this encounter.   Hall-Potvin, [place], [place] 08/21/20 2049

## 2020-08-21 NOTE — Discharge Instructions (Signed)
Cold therapy (ice packs) can be used to help swelling both after injury and after prolonged use of areas of chronic pain/aches.  Pain medication:  500 mg Naprosyn/Aleve (naproxen) every 12 hours with food:  AVOID other NSAIDs while taking this (may have Tylenol).  May take muscle relaxer as needed for severe pain / spasm.  (This medication may cause you to become tired so it is important you do not drink alcohol or operate heavy machinery while on this medication.  Recommend your first dose to be taken before bedtime to monitor for side effects safely)  Important to follow up with specialist(s) below for further evaluation/management if your symptoms persist or worsen.

## 2020-08-21 NOTE — ED Triage Notes (Signed)
Pt states she pulled her right shoulder at work this am or last night. Pt has good range of motion with pain. Pt states she has not tried any otc or heat/cold to alleviate the pain. Pt aox4 and ambulatory.

## 2021-04-19 ENCOUNTER — Other Ambulatory Visit: Payer: Self-pay

## 2021-04-19 ENCOUNTER — Ambulatory Visit
Admission: EM | Admit: 2021-04-19 | Discharge: 2021-04-19 | Disposition: A | Discharge status: Home / Self Care | Payer: Federal, State, Local not specified - PPO | Attending: Emergency Medicine | Admitting: Emergency Medicine

## 2021-04-19 DIAGNOSIS — B9689 Other specified bacterial agents as the cause of diseases classified elsewhere: Secondary | ICD-10-CM | POA: Diagnosis not present

## 2021-04-19 DIAGNOSIS — N390 Urinary tract infection, site not specified: Secondary | ICD-10-CM

## 2021-04-19 LAB — POCT URINALYSIS DIP (MANUAL ENTRY)
Bilirubin, UA: NEGATIVE
Glucose, UA: NEGATIVE mg/dL
Ketones, POC UA: NEGATIVE mg/dL
Nitrite, UA: POSITIVE — AB
Protein Ur, POC: NEGATIVE mg/dL
Spec Grav, UA: >=1.03 — AB (ref 1.010–1.025)
Urobilinogen, UA: 0.2 E.U./dL
pH, UA: 7 (ref 5.0–8.0)

## 2021-04-19 MED ORDER — NITROFURANTOIN MONOHYD MACRO 100 MG PO CAPS: 100.0000 mg | ORAL_CAPSULE | Freq: Two times a day (BID) | ORAL | 0 refills | Status: AC

## 2021-04-19 NOTE — ED Triage Notes (Signed)
Pt c/o burning on urination with urgency and frequency for the past few week.

## 2021-04-19 NOTE — ED Provider Notes (Signed)
[name]    CSN: [id number] Arrival date & time: 04/19/21  0816      History   Chief Complaint Chief Complaint  Patient presents with  . Urinary Tract Infection    HPI Annette Garrett is a 45 y.o. female history of asthma, intracranial hypertension presenting today for evaluation of possible UTI.  Reports dysuria, frequency and urgency x1 week.  Denies any fevers nausea or vomiting.  Denies current back pain or abdominal pain.  Denies vaginal symptoms.  Reports prior UTIs, last one in 2019.  HPI  Past Medical History:  Diagnosis Date  . Asthma   . Intracranial hypertension     Patient Active Problem List   Diagnosis Date Noted  . GERD 08/21/2008  . CHEST PAIN 08/21/2008    Past Surgical History:  Procedure Laterality Date  . ABDOMINAL HYSTERECTOMY    . TUBAL LIGATION      OB History    Gravida  5   Para  3   Term      Preterm      AB  2   Living  3     SAB  2   IAB      Ectopic      Multiple      Live Births               Home Medications    Prior to Admission medications   Medication Sig Start Date End Date Taking? Authorizing Provider  nitrofurantoin, macrocrystal-monohydrate, (MACROBID) 100 MG capsule Take 1 capsule (100 mg total) by mouth 2 (two) times daily for 5 days. 04/19/21 04/24/21 Yes Shalondra Wunschel C, PA-C  acetaminophen (TYLENOL) 500 MG tablet Take 1,000 mg by mouth every 6 (six) hours as needed for mild pain or headache.     [provider]  albuterol (PROAIR HFA) 108 (90 BASE) MCG/ACT inhaler Inhale 2 puffs into the lungs every 6 (six) hours as needed. For wheezing and shortness of breath.    [provider]  albuterol (VENTOLIN HFA) 108 (90 Base) MCG/ACT inhaler Inhale into the lungs.    [provider]  albuterol (VENTOLIN HFA) 108 (90 Base) MCG/ACT inhaler Inhale into the lungs. 02/11/19   [provider]  cetirizine (ZYRTEC) 10 MG tablet Take 10 mg by mouth daily.     [provider]  cetirizine (ZYRTEC) 10 MG tablet Take by mouth. 02/11/19   [provider]  cyclobenzaprine (FLEXERIL) 10 MG tablet Take 1 tablet (10 mg total) by mouth 3 (three) times daily as needed for muscle spasms. 08/21/20   Hall-Potvin, [place], PA-C  EPINEPHrine (EPIPEN 2-PAK) 0.3 mg/0.3 mL IJ SOAJ injection Inject 0.3 mg into the muscle once as needed (allergic reaction.).    [provider]  EPINEPHrine 0.3 mg/0.3 mL IJ SOAJ injection Inject into the muscle.    [provider]  furosemide (LASIX) 40 MG tablet Take 40 mg by mouth daily.    [provider]  furosemide (LASIX) 40 MG tablet Take by mouth. 10/15/19 10/14/20  [provider]  pantoprazole (PROTONIX) 20 MG tablet Take 1 tablet (20 mg total) by mouth 2 (two) times daily. Patient taking differently: Take 20 mg by mouth daily as needed for heartburn.  09/20/14   [name], MD  pantoprazole (PROTONIX) 20 MG tablet Take by mouth. 09/10/17   [provider]    Family History Family History  Problem Relation Age of Onset  . Hypertension Mother   . Diabetes  Father   . Diabetes Maternal Grandmother   . Hypertension Maternal Grandmother     Social History Social History   Tobacco Use  . Smoking status: Never Smoker  . Smokeless tobacco: Never Used  Substance Use Topics  . Alcohol use: Yes    Comment: social  . Drug use: No     Allergies   Other, Oxycodone-acetaminophen, and Oxycodone-acetaminophen   Review of Systems Review of Systems  Constitutional: Negative for fever.  Respiratory: Negative for shortness of breath.   Cardiovascular: Negative for chest pain.  Gastrointestinal: Negative for abdominal pain, diarrhea, nausea and vomiting.  Genitourinary: Positive for dysuria, frequency and urgency. Negative for flank pain, genital sores, hematuria, menstrual problem, vaginal bleeding, vaginal discharge and vaginal pain.  Musculoskeletal: Negative for  back pain.  Skin: Negative for rash.  Neurological: Negative for dizziness, light-headedness and headaches.     Physical Exam Triage Vital Signs ED Triage Vitals  Enc Vitals Group     BP      Pulse      Resp      Temp      Temp src      SpO2      Weight      Height      Head Circumference      Peak Flow      Pain Score      Pain Loc      Pain Edu?      Excl. in [place]?    No data found.  Updated Vital Signs BP 121/85 (BP Location: Left Arm)   Pulse 88   Temp 99.2 F (37.3 C) (Oral)   Resp 18   SpO2 97%   Visual Acuity Right Eye Distance:   Left Eye Distance:   Bilateral Distance:    Right Eye Near:   Left Eye Near:    Bilateral Near:     Physical Exam Vitals and nursing note reviewed.  Constitutional:      Appearance: She is well-developed.     Comments: No acute distress  HENT:     Head: Normocephalic and atraumatic.     Nose: Nose normal.  Eyes:     Conjunctiva/sclera: Conjunctivae normal.  Cardiovascular:     Rate and Rhythm: Normal rate.  Pulmonary:     Effort: Pulmonary effort is normal. No respiratory distress.  Abdominal:     General: There is no distension.  Musculoskeletal:        General: Normal range of motion.     Cervical back: Neck supple.  Skin:    General: Skin is warm and dry.  Neurological:     Mental Status: She is alert and oriented to person, place, and time.      UC Treatments / Results  Labs (all labs ordered are listed, but only abnormal results are displayed) Labs Reviewed  POCT URINALYSIS DIP (MANUAL ENTRY) - Abnormal; Notable for the following components:      Result Value   Spec Grav, UA >=1.030 (*)    Blood, UA trace-intact (*)    Nitrite, UA Positive (*)    Leukocytes, UA Trace (*)    All other components within normal limits  URINE CULTURE    EKG   Radiology No results found.  Procedures Procedures (including critical care time)  Medications Ordered in UC Medications - No data to  display  Initial Impression / Assessment and Plan / UC Course  I have reviewed the triage vital signs and the nursing  notes.  Pertinent labs & imaging results that were available during my care of the patient were reviewed by me and considered in my medical decision making (see chart for details).     Trace leuks, positive nitrites, treating for UTI with Macrobid x5 days.  Urine culture pending, patient fluids.  Discussed strict return precautions. Patient verbalized understanding and is agreeable with plan.  Final Clinical Impressions(s) / UC Diagnoses   Final diagnoses:  Lower urinary tract infection, acute     Discharge Instructions     Urine showed evidence of infection. We are treating you with macrobid- twice daily for 5 days. Be sure to take full course. Stay hydrated- urine should be pale yellow to clear.   Please return or follow up with your primary provider if symptoms not improving with treatment. Please return sooner if you have worsening of symptoms or develop fever, nausea, vomiting, abdominal pain, back pain, lightheadedness, dizziness.    ED Prescriptions    Medication Sig Dispense Auth. Provider   nitrofurantoin, macrocrystal-monohydrate, (MACROBID) 100 MG capsule Take 1 capsule (100 mg total) by mouth 2 (two) times daily for 5 days. 10 capsule Otto Caraway, [place] C, PA-C     PDMP not reviewed this encounter.   Lew Dawes, [place] 04/19/21 [phone]

## 2021-04-19 NOTE — Discharge Instructions (Addendum)
Urine showed evidence of infection. We are treating you with macrobid- twice daily for 5 days. Be sure to take full course. Stay hydrated- urine should be pale yellow to clear.  °Please return or follow up with your primary provider if symptoms not improving with treatment. Please return sooner if you have worsening of symptoms or develop fever, nausea, vomiting, abdominal pain, back pain, lightheadedness, dizziness. °

## 2021-04-21 LAB — URINE CULTURE

## 2021-04-25 ENCOUNTER — Ambulatory Visit
Admission: EM | Admit: 2021-04-25 | Discharge: 2021-04-25 | Disposition: A | Discharge status: Home / Self Care | Payer: Federal, State, Local not specified - PPO | Attending: Emergency Medicine | Admitting: Emergency Medicine

## 2021-04-25 ENCOUNTER — Other Ambulatory Visit: Payer: Self-pay

## 2021-04-25 ENCOUNTER — Encounter: Payer: Self-pay | Admitting: *Deleted

## 2021-04-25 DIAGNOSIS — Z113 Encounter for screening for infections with a predominantly sexual mode of transmission: Secondary | ICD-10-CM | POA: Diagnosis not present

## 2021-04-25 DIAGNOSIS — Z87440 Personal history of urinary (tract) infections: Secondary | ICD-10-CM | POA: Insufficient documentation

## 2021-04-25 DIAGNOSIS — R350 Frequency of micturition: Secondary | ICD-10-CM | POA: Insufficient documentation

## 2021-04-25 DIAGNOSIS — Z79899 Other long term (current) drug therapy: Secondary | ICD-10-CM | POA: Insufficient documentation

## 2021-04-25 LAB — POCT URINALYSIS DIP (MANUAL ENTRY)
Bilirubin, UA: NEGATIVE
Glucose, UA: NEGATIVE mg/dL
Ketones, POC UA: NEGATIVE mg/dL
Leukocytes, UA: NEGATIVE
Nitrite, UA: NEGATIVE
Protein Ur, POC: NEGATIVE mg/dL
Spec Grav, UA: >=1.03 — AB (ref 1.010–1.025)
Urobilinogen, UA: 0.2 E.U./dL
pH, UA: 5.5 (ref 5.0–8.0)

## 2021-04-25 NOTE — ED Triage Notes (Signed)
Reports finishing abx for UTI 2 days ago.  States continues with polyuria, but dysuria resolved; would like to ensure UTI is resolved.  Also requesting vaginal swab testing for BV and STIs.

## 2021-04-25 NOTE — ED Provider Notes (Signed)
[name]    CSN: [id number] Arrival date & time: 04/25/21  8527      History   Chief Complaint No chief complaint on file. Urinary frequency  HPI Annette Garrett is a 45 y.o. female history of asthma presenting today for evaluation of UTI.  Reports recently finished antibiotics, continues to have urinary frequency, but dysuria has resolved.  Also would like to be screened for BV and STDs.  Was seen here on 5/9 and treated for UTI with Macrobid, culture returned with multiple species present  HPI  Past Medical History:  Diagnosis Date  . Asthma   . Intracranial hypertension     Patient Active Problem List   Diagnosis Date Noted  . GERD 08/21/2008  . CHEST PAIN 08/21/2008    Past Surgical History:  Procedure Laterality Date  . ABDOMINAL HYSTERECTOMY    . TUBAL LIGATION      OB History    Gravida  5   Para  3   Term      Preterm      AB  2   Living  3     SAB  2   IAB      Ectopic      Multiple      Live Births               Home Medications    Prior to Admission medications   Medication Sig Start Date End Date Taking? Authorizing Provider  cetirizine (ZYRTEC) 10 MG tablet Take by mouth. 02/11/19  Yes [provider]  cyclobenzaprine (FLEXERIL) 10 MG tablet Take 1 tablet (10 mg total) by mouth 3 (three) times daily as needed for muscle spasms. 08/21/20  Yes Hall-Potvin, [place], PA-C  furosemide (LASIX) 40 MG tablet Take 40 mg by mouth daily.   Yes [provider]  NITROFURANTOIN PO Take by mouth.   Yes [provider]  pantoprazole (PROTONIX) 20 MG tablet Take 1 tablet (20 mg total) by mouth 2 (two) times daily. Patient taking differently: Take 20 mg by mouth daily as needed for heartburn. 09/20/14  Yes [name], MD  acetaminophen (TYLENOL) 500 MG tablet Take 1,000 mg by mouth every 6 (six) hours as needed for mild pain or headache.     [provider]  albuterol (PROAIR HFA) 108 (90  BASE) MCG/ACT inhaler Inhale 2 puffs into the lungs every 6 (six) hours as needed. For wheezing and shortness of breath.    [provider]  albuterol (VENTOLIN HFA) 108 (90 Base) MCG/ACT inhaler Inhale into the lungs.    [provider]  albuterol (VENTOLIN HFA) 108 (90 Base) MCG/ACT inhaler Inhale into the lungs. 02/11/19   [provider]  cetirizine (ZYRTEC) 10 MG tablet Take 10 mg by mouth daily.    [provider]  EPINEPHrine (EPIPEN 2-PAK) 0.3 mg/0.3 mL IJ SOAJ injection Inject 0.3 mg into the muscle once as needed (allergic reaction.).    [provider]  EPINEPHrine 0.3 mg/0.3 mL IJ SOAJ injection Inject into the muscle.    [provider]  furosemide (LASIX) 40 MG tablet Take by mouth. 10/15/19 10/14/20  [provider]  pantoprazole (PROTONIX) 20 MG tablet Take by mouth. 09/10/17   [provider]    Family History Family History  Problem Relation Age of Onset  . Hypertension Mother   . Diabetes Father   . Diabetes Maternal Grandmother   . Hypertension Maternal Grandmother     Social History  Social History   Tobacco Use  . Smoking status: Never Smoker  . Smokeless tobacco: Never Used  Vaping Use  . Vaping Use: Never used  Substance Use Topics  . Alcohol use: Yes    Comment: social  . Drug use: No     Allergies   Other, Oxycodone-acetaminophen, and Oxycodone-acetaminophen   Review of Systems Review of Systems  Constitutional: Negative for fever.  Respiratory: Negative for shortness of breath.   Cardiovascular: Negative for chest pain.  Gastrointestinal: Negative for abdominal pain, diarrhea, nausea and vomiting.  Genitourinary: Positive for frequency. Negative for dysuria, flank pain, genital sores, hematuria, menstrual problem, vaginal bleeding, vaginal discharge and vaginal pain.  Musculoskeletal: Negative for back pain.  Skin: Negative for rash.  Neurological: Negative for dizziness,  light-headedness and headaches.     Physical Exam Triage Vital Signs ED Triage Vitals  Enc Vitals Group     BP 04/25/21 0911 110/78     Pulse Rate 04/25/21 0911 99     Resp 04/25/21 0911 18     Temp 04/25/21 0911 98.5 F (36.9 C)     Temp Source 04/25/21 0911 Oral     SpO2 04/25/21 0911 97 %     Weight --      Height --      Head Circumference --      Peak Flow --      Pain Score 04/25/21 0912 0     Pain Loc --      Pain Edu? --      Excl. in GC? --    No data found.  Updated Vital Signs BP 110/78   Pulse 99   Temp 98.5 F (36.9 C) (Oral)   Resp 18   SpO2 97%   Visual Acuity Right Eye Distance:   Left Eye Distance:   Bilateral Distance:    Right Eye Near:   Left Eye Near:    Bilateral Near:     Physical Exam Vitals and nursing note reviewed.  Constitutional:      Appearance: She is well-developed.     Comments: No acute distress  HENT:     Head: Normocephalic and atraumatic.     Nose: Nose normal.  Eyes:     Conjunctiva/sclera: Conjunctivae normal.  Cardiovascular:     Rate and Rhythm: Normal rate and regular rhythm.  Pulmonary:     Effort: Pulmonary effort is normal. No respiratory distress.     Breath sounds: Normal breath sounds.  Abdominal:     General: There is no distension.  Musculoskeletal:        General: Normal range of motion.     Cervical back: Neck supple.  Skin:    General: Skin is warm and dry.  Neurological:     Mental Status: She is alert and oriented to person, place, and time.      UC Treatments / Results  Labs (all labs ordered are listed, but only abnormal results are displayed) Labs Reviewed  POCT URINALYSIS DIP (MANUAL ENTRY) - Abnormal; Notable for the following components:      Result Value   Spec Grav, UA >=1.030 (*)    Blood, UA trace-intact (*)    All other components within normal limits  CERVICOVAGINAL ANCILLARY ONLY    EKG   Radiology No results found.  Procedures Procedures (including critical  care time)  Medications Ordered in UC Medications - No data to display  Initial Impression / Assessment and Plan / UC Course  I  have reviewed the triage vital signs and the nursing notes.  Pertinent labs & imaging results that were available during my care of the patient were reviewed by me and considered in my medical decision making (see chart for details).     UA with negative leuks and nitrates today, vaginal swab pending for screening of any vaginal infections contributing to urinary frequency.  Encouraged to drink plenty of water and fluids, will provide further therapy as needed based off results.  Discussed strict return precautions. Patient verbalized understanding and is agreeable with plan.  Final Clinical Impressions(s) / UC Diagnoses   Final diagnoses:  Urinary frequency     Discharge Instructions     Urine without signs of infection, Please drink plenty of water  We are testing you for Gonorrhea, Chlamydia, Trichomonas, Yeast and Bacterial Vaginosis. We will call you if anything is positive and let you know if you require any further treatment. Please inform partners of any positive results.   Please return if symptoms not improving with treatment, development of fever, nausea, vomiting, abdominal pain.     ED Prescriptions    None     PDMP not reviewed this encounter.   Lew Dawes, [place] 04/25/21 [phone]

## 2021-04-25 NOTE — Discharge Instructions (Signed)
Urine without signs of infection, Please drink plenty of water  We are testing you for Gonorrhea, Chlamydia, Trichomonas, Yeast and Bacterial Vaginosis. We will call you if anything is positive and let you know if you require any further treatment. Please inform partners of any positive results.   Please return if symptoms not improving with treatment, development of fever, nausea, vomiting, abdominal pain.

## 2021-04-26 LAB — CERVICOVAGINAL ANCILLARY ONLY
Bacterial Vaginitis (gardnerella): POSITIVE — AB
Candida Glabrata: NEGATIVE
Candida Vaginitis: NEGATIVE
Chlamydia: NEGATIVE
Comment: NEGATIVE
Comment: NEGATIVE
Comment: NEGATIVE
Comment: NEGATIVE
Comment: NEGATIVE
Comment: NORMAL
Neisseria Gonorrhea: NEGATIVE
Trichomonas: NEGATIVE

## 2021-04-29 ENCOUNTER — Telehealth (HOSPITAL_COMMUNITY): Payer: Self-pay | Admitting: Emergency Medicine

## 2021-04-29 MED ORDER — METRONIDAZOLE 0.75 % VA GEL: 1.0000 | Freq: Every day | VAGINAL | 0 refills | Status: AC

## 2022-12-16 ENCOUNTER — Ambulatory Visit
Admission: EM | Admit: 2022-12-16 | Discharge: 2022-12-16 | Disposition: A | Discharge status: Home / Self Care | Payer: 59 | Attending: Nurse Practitioner | Admitting: Nurse Practitioner

## 2022-12-16 DIAGNOSIS — J4520 Mild intermittent asthma, uncomplicated: Secondary | ICD-10-CM | POA: Diagnosis not present

## 2022-12-16 DIAGNOSIS — J069 Acute upper respiratory infection, unspecified: Secondary | ICD-10-CM | POA: Diagnosis not present

## 2022-12-16 MED ORDER — FLUTICASONE-SALMETEROL 115-21 MCG/ACT IN AERO: 2.0000 | INHALATION_SPRAY | Freq: Two times a day (BID) | RESPIRATORY_TRACT | 0 refills | Status: DC

## 2022-12-16 NOTE — ED Triage Notes (Signed)
Pt presents to uc with co of chills, cough, congestion, body aches since 2 days ago.Pt reports using otc medications

## 2022-12-16 NOTE — Discharge Instructions (Signed)
Your symptoms and exam are consistent for a viral illness. Please treat your symptoms with over the counter cough medication, tylenol or ibuprofen, humidifier, and rest.  Continue albuterol inhaler as needed.  Advair twice daily for 7 days.  Viral illnesses can last 7-14 days. Please follow up with your PCP if your symptoms are not improving. Please go to the ER for any worsening symptoms. This includes but is not limited to fever you can not control with tylenol or ibuprofen, you are not able to stay hydrated, you have shortness of breath or chest pain.  Thank you for choosing [facility] for your healthcare needs. I hope you feel better soon!

## 2022-12-16 NOTE — ED Provider Notes (Signed)
[name]    CSN: [id number] Arrival date & time: 12/16/22  1710      History   Chief Complaint Chief Complaint  Patient presents with   URI    HPI Annette Garrett is a 47 y.o. female who presents for evaluation of URI symptoms for 3 days. Patient reports associated symptoms of cough, congestion, body aches, chills, wheezing. Denies N/V/D, fevers, ear pain, sore throat, shortness of breath. Patient does have a hx of asthma.  Has not albuterol inhaler which she has been using with temporary relief.  She used to be on Advair but is no longer.  nr smoking. No known sick contacts and no recent travel. Pt is vaccinated for COVID. Pt is vaccinated for flu this season. Pt has taken herbal teas OTC for symptoms. Pt has no other concerns at this time.    URI Presenting symptoms: congestion, cough and sore throat   Associated symptoms: myalgias and wheezing     Past Medical History:  Diagnosis Date   Asthma    Intracranial hypertension     Patient Active Problem List   Diagnosis Date Noted   GERD 08/21/2008   CHEST PAIN 08/21/2008    Past Surgical History:  Procedure Laterality Date   ABDOMINAL HYSTERECTOMY     TUBAL LIGATION      OB History     Gravida  5   Para  3   Term      Preterm      AB  2   Living  3      SAB  2   IAB      Ectopic      Multiple      Live Births               Home Medications    Prior to Admission medications   Medication Sig Start Date End Date Taking? Authorizing Provider  fluticasone-salmeterol (ADVAIR HFA) 115-21 MCG/ACT inhaler Inhale 2 puffs into the lungs 2 (two) times daily for 7 days. 12/16/22 12/23/22 Yes Melynda Ripple, NP  acetaminophen (TYLENOL) 500 MG tablet Take 1,000 mg by mouth every 6 (six) hours as needed for mild pain or headache.     [provider]  albuterol (PROAIR HFA) 108 (90 BASE) MCG/ACT inhaler Inhale 2 puffs into the lungs every 6 (six) hours as needed. For wheezing and  shortness of breath.    [provider]  albuterol (VENTOLIN HFA) 108 (90 Base) MCG/ACT inhaler Inhale into the lungs.    [provider]  albuterol (VENTOLIN HFA) 108 (90 Base) MCG/ACT inhaler Inhale into the lungs. 02/11/19   [provider]  cetirizine (ZYRTEC) 10 MG tablet Take 10 mg by mouth daily.    [provider]  cetirizine (ZYRTEC) 10 MG tablet Take by mouth. 02/11/19   [provider]  cyclobenzaprine (FLEXERIL) 10 MG tablet Take 1 tablet (10 mg total) by mouth 3 (three) times daily as needed for muscle spasms. 08/21/20   Hall-Potvin, [place], PA-C  EPINEPHrine (EPIPEN 2-PAK) 0.3 mg/0.3 mL IJ SOAJ injection Inject 0.3 mg into the muscle once as needed (allergic reaction.).    [provider]  EPINEPHrine 0.3 mg/0.3 mL IJ SOAJ injection Inject into the muscle.    [provider]  furosemide (LASIX) 40 MG tablet Take 40 mg by mouth daily.    [provider]  furosemide (LASIX) 40 MG tablet Take by mouth. 10/15/19 10/14/20  [provider]  NITROFURANTOIN PO  Take by mouth.    [provider]  pantoprazole (PROTONIX) 20 MG tablet Take 1 tablet (20 mg total) by mouth 2 (two) times daily. Patient taking differently: Take 20 mg by mouth daily as needed for heartburn. 09/20/14   Virgel Manifold, MD  pantoprazole (PROTONIX) 20 MG tablet Take by mouth. 09/10/17   [provider]    Family History Family History  Problem Relation Age of Onset   Hypertension Mother    Diabetes Father    Diabetes Maternal Grandmother    Hypertension Maternal Grandmother     Social History Social History   Tobacco Use   Smoking status: Never   Smokeless tobacco: Never  Vaping Use   Vaping Use: Never used  Substance Use Topics   Alcohol use: Yes    Comment: social   Drug use: No     Allergies   Other, Oxycodone-acetaminophen, and Oxycodone-acetaminophen   Review of Systems Review of Systems   Constitutional:  Positive for chills.  HENT:  Positive for congestion and sore throat.   Respiratory:  Positive for cough and wheezing.   Musculoskeletal:  Positive for myalgias.     Physical Exam Triage Vital Signs ED Triage Vitals [12/16/22 1819]  Enc Vitals Group     BP 120/78     Pulse Rate 90     Resp 18     Temp 98.1 F (36.7 C)     Temp src      SpO2 98 %     Weight      Height      Head Circumference      Peak Flow      Pain Score 5     Pain Loc      Pain Edu?      Excl. in [place]?    No data found.  Updated Vital Signs BP 120/78   Pulse 90   Temp 98.1 F (36.7 C)   Resp 18   SpO2 98%   Visual Acuity Right Eye Distance:   Left Eye Distance:   Bilateral Distance:    Right Eye Near:   Left Eye Near:    Bilateral Near:     Physical Exam Vitals and nursing note reviewed.  Constitutional:      General: She is not in acute distress.    Appearance: She is well-developed. She is not ill-appearing.  HENT:     Head: Normocephalic and atraumatic.     Right Ear: Tympanic membrane and ear canal normal.     Left Ear: Tympanic membrane and ear canal normal.     Nose: Congestion present.     Mouth/Throat:     Mouth: Mucous membranes are moist.     Pharynx: Oropharynx is clear. Uvula midline. Posterior oropharyngeal erythema present.     Tonsils: No tonsillar exudate or tonsillar abscesses.  Eyes:     Conjunctiva/sclera: Conjunctivae normal.     Pupils: Pupils are equal, round, and reactive to light.  Cardiovascular:     Rate and Rhythm: Normal rate and regular rhythm.     Heart sounds: Normal heart sounds.  Pulmonary:     Effort: Pulmonary effort is normal.     Breath sounds: Normal breath sounds.  Musculoskeletal:     Cervical back: Normal range of motion and neck supple.  Lymphadenopathy:     Cervical: No cervical adenopathy.  Skin:    General: Skin is warm and dry.  Neurological:     General: No focal deficit  present.     Mental Status: She is  alert and oriented to person, place, and time.  Psychiatric:        Mood and Affect: Mood normal.        Behavior: Behavior normal.      UC Treatments / Results  Labs (all labs ordered are listed, but only abnormal results are displayed) Labs Reviewed - No data to display  EKG   Radiology No results found.  Procedures Procedures (including critical care time)  Medications Ordered in UC Medications - No data to display  Initial Impression / Assessment and Plan / UC Course  I have reviewed the triage vital signs and the nursing notes.  Pertinent labs & imaging results that were available during my care of the patient were reviewed by me and considered in my medical decision making (see chart for details).     Discussed with patient viral illness and symptomatic treatment  Declined COVID testing Advair x 7 days Continue albuterol inhaler Patient declined Rx cough medicine will use OTC cough medicine Rest and fluids Follow up with PCP in 2-3 days for re-check  ER precautions reviewed and pt verbalized understanding  Final Clinical Impressions(s) / UC Diagnoses   Final diagnoses:  Acute upper respiratory infection  Mild intermittent asthma, unspecified whether complicated     Discharge Instructions      Your symptoms and exam are consistent for a viral illness. Please treat your symptoms with over the counter cough medication, tylenol or ibuprofen, humidifier, and rest.  Continue albuterol inhaler as needed.  Advair twice daily for 7 days.  Viral illnesses can last 7-14 days. Please follow up with your PCP if your symptoms are not improving. Please go to the ER for any worsening symptoms. This includes but is not limited to fever you can not control with tylenol or ibuprofen, you are not able to stay hydrated, you have shortness of breath or chest pain.  Thank you for choosing [facility] for your healthcare needs. I hope you feel better soon!    ED Prescriptions      Medication Sig Dispense Auth. Provider   fluticasone-salmeterol (ADVAIR HFA) 115-21 MCG/ACT inhaler Inhale 2 puffs into the lungs 2 (two) times daily for 7 days. 1 each Melynda Ripple, NP      PDMP not reviewed this encounter.   Melynda Ripple, NP 12/16/22 [phone]

## 2024-10-02 ENCOUNTER — Ambulatory Visit
Admission: EM | Admit: 2024-10-02 | Discharge: 2024-10-02 | Disposition: A | Discharge status: Home / Self Care | Attending: Family Medicine | Admitting: Family Medicine

## 2024-10-02 ENCOUNTER — Encounter: Payer: Self-pay | Admitting: Emergency Medicine

## 2024-10-02 DIAGNOSIS — N3090 Cystitis, unspecified without hematuria: Secondary | ICD-10-CM | POA: Insufficient documentation

## 2024-10-02 DIAGNOSIS — R1013 Epigastric pain: Secondary | ICD-10-CM | POA: Insufficient documentation

## 2024-10-02 DIAGNOSIS — R1310 Dysphagia, unspecified: Secondary | ICD-10-CM | POA: Insufficient documentation

## 2024-10-02 LAB — POCT URINE DIPSTICK
Bilirubin, UA: NEGATIVE
Glucose, UA: 100 mg/dL — AB
Nitrite, UA: POSITIVE — AB
POC PROTEIN,UA: 100 — AB
Spec Grav, UA: <=1.005 — AB (ref 1.010–1.025)
Urobilinogen, UA: 4 U/dL — AB
pH, UA: 5 (ref 5.0–8.0)

## 2024-10-02 MED ORDER — CEPHALEXIN 250 MG PO CAPS: 250.0000 mg | ORAL_CAPSULE | Freq: Three times a day (TID) | ORAL | 0 refills | Status: AC

## 2024-10-02 NOTE — Discharge Instructions (Addendum)
 The urinalysis had white blood cells and nitrites, consistent with a bladder infection.  Cephalexin 500 mg --1 tablet by mouth 2 times daily for 7 days.  You can continue the AZO you have been taking  Drink plenty of fluids  Urine culture is sent, and staff will notify you that it looks like the antibiotic needs to be changed.

## 2024-10-02 NOTE — ED Provider Notes (Signed)
 [name]    CSN: [id number] Arrival date & time: 10/02/24  0807      History   Chief Complaint Chief Complaint  Patient presents with   Dysuria   Urinary Frequency    HPI Annette Garrett is a 48 y.o. female.    Dysuria Urinary Frequency   Here for dysuria and urinary frequency and incomplete bladder emptying that has been going on for 5 days.  No f/c/n/v.  She is allergic to oxycodone.  Has had a hyst. Last eGFR in epic was 40.  Past Medical History:  Diagnosis Date   Asthma    Intracranial hypertension     Patient Active Problem List   Diagnosis Date Noted   Dysphagia 10/02/2024   Epigastric pain 10/02/2024   Tonsil asymmetry 09/20/2016   Tonsil stone 09/20/2016   OSA (obstructive sleep apnea) 02/21/2016   Elevated intracranial pressure 05/06/2015   Empty sella 03/03/2015   Mild intermittent asthma without complication 03/03/2015   Other headache syndrome 03/03/2015   Gastro-esophageal reflux disease without esophagitis 08/21/2008   CHEST PAIN 08/21/2008    Past Surgical History:  Procedure Laterality Date   ABDOMINAL HYSTERECTOMY     TUBAL LIGATION      OB History     Gravida  5   Para  3   Term      Preterm      AB  2   Living  3      SAB  2   IAB      Ectopic      Multiple      Live Births               Home Medications    Prior to Admission medications   Medication Sig Start Date End Date Taking? Authorizing Provider  albuterol  (PROAIR  HFA) 108 (90 BASE) MCG/ACT inhaler Inhale 2 puffs into the lungs every 6 (six) hours as needed. For wheezing and shortness of breath.   Yes [provider]  [facility] HFA 100 MCG/ACT AERO SMARTSIG:2 Puff(s) By Mouth Twice Daily 09/20/24  Yes [provider]  butalbital-acetaminophen -caffeine (FIORICET) 50-325-40 MG tablet Take 1 tablet by mouth every 6 (six) hours as needed. 01/27/22  Yes [provider]  cephALEXin (KEFLEX) 250 MG capsule  Take 1 capsule (250 mg total) by mouth 3 (three) times daily for 7 days. 10/02/24 10/09/24 Yes Chaun Uemura K, MD  cetirizine (ZYRTEC) 10 MG tablet Take by mouth. 02/11/19  Yes [provider]  cyclobenzaprine  (FLEXERIL ) 10 MG tablet Take 1 tablet (10 mg total) by mouth 3 (three) times daily as needed for muscle spasms. 08/21/20  Yes Hall-Potvin, [place], PA-C  furosemide (LASIX) 40 MG tablet Take 40 mg by mouth daily.   Yes [provider]  montelukast (SINGULAIR) 10 MG tablet Take 10 mg by mouth daily. 09/19/24  Yes [provider]  naproxen  (NAPROSYN ) 500 MG tablet Take 500 mg by mouth 2 (two) times daily.   Yes [provider]  pantoprazole  (PROTONIX ) 20 MG tablet Take 1 tablet (20 mg total) by mouth 2 (two) times daily. Patient taking differently: Take 20 mg by mouth daily as needed for heartburn. 09/20/14  Yes Loetta Senior, MD  WEGOVY 0.25 MG/0.5ML SOAJ SQ injection SMARTSIG:0.25 Milligram(s) SUB-Q Once a Week 09/19/24  Yes [provider]  acetaminophen  (TYLENOL ) 500 MG tablet Take 1,000 mg by mouth every 6 (six) hours as needed for mild pain or headache.     [provider]  albuterol  (VENTOLIN  HFA) 108 (90 Base) MCG/ACT inhaler Inhale into the lungs.    [provider]  albuterol  (VENTOLIN  HFA) 108 (90 Base) MCG/ACT inhaler Inhale into the lungs. 02/11/19   [provider]  cetirizine (ZYRTEC) 10 MG tablet Take 10 mg by mouth daily.    [provider]  Cholecalciferol 1.25 MG (50000 UT) capsule Take by mouth. Patient not taking: Reported on 10/02/2024 01/31/22   [provider]  diazepam (VALIUM) 5 MG tablet     [provider]  EPINEPHrine  (EPIPEN  2-PAK) 0.3 mg/0.3 mL IJ SOAJ injection Inject 0.3 mg into the muscle once as needed (allergic reaction.). Patient not taking: Reported on 10/02/2024    [provider]  EPINEPHrine  0.3 mg/0.3 mL IJ SOAJ injection Inject into the  muscle. Patient not taking: Reported on 10/02/2024    [provider]  fluticasone -salmeterol (ADVAIR HFA) 115-21 MCG/ACT inhaler Inhale 2 puffs into the lungs 2 (two) times daily for 7 days. Patient not taking: Reported on 10/02/2024 12/16/22 12/23/22  Mayer, Jodi R, NP  hydrocortisone  (ANUCORT-HC ) 25 MG suppository  01/28/21   [provider]  metroNIDAZOLE  (METROGEL ) 0.75 % vaginal gel PLACE 1 APPLICATORFUL VAGINALLY AT BEDTIME FOR 5 DAYS Patient not taking: Reported on 10/02/2024    [provider]  NITROFURANTOIN  PO Take by mouth. Patient not taking: Reported on 10/02/2024    [provider]  nortriptyline (PAMELOR) 10 MG capsule Take by mouth. Patient not taking: Reported on 10/02/2024 09/08/22   [provider]  pantoprazole  (PROTONIX ) 20 MG tablet Take by mouth. 09/10/17   [provider]  predniSONE  (DELTASONE ) 10 MG tablet Take 4 tabs daily for 2 days, then 3 tabs daily for 2 days, then 2 tabs daily for 2 days, then 1 tab daily for 2 days Patient not taking: Reported on 10/02/2024 03/24/22   [provider]    Family History Family History  Problem Relation Age of Onset   Hypertension Mother    Diabetes Father    Diabetes Maternal Grandmother    Hypertension Maternal Grandmother     Social History Social History   Tobacco Use   Smoking status: Never   Smokeless tobacco: Never  Vaping Use   Vaping status: Never Used  Substance Use Topics   Alcohol use: Yes    Comment: social   Drug use: No     Allergies   Other, Oxycodone-acetaminophen , Codeine, and Oxycodone-acetaminophen    Review of Systems Review of Systems  Genitourinary:  Positive for dysuria and frequency.     Physical Exam Triage Vital Signs ED Triage Vitals [10/02/24 0853]  Encounter Vitals Group     BP 133/85     Girls Systolic BP Percentile      Girls Diastolic BP Percentile      Boys Systolic BP Percentile      Boys Diastolic BP  Percentile      Pulse Rate 80     Resp 16     Temp 98 F (36.7 C)     Temp Source Oral     SpO2 95 %     Weight      Height      Head Circumference      Peak Flow      Pain Score 0     Pain Loc      Pain Education      Exclude from Growth Chart    No data found.  Updated Vital Signs BP 133/85 (  BP Location: Left Arm)   Pulse 80   Temp 98 F (36.7 C) (Oral)   Resp 16   SpO2 95%   Visual Acuity Right Eye Distance:   Left Eye Distance:   Bilateral Distance:    Right Eye Near:   Left Eye Near:    Bilateral Near:     Physical Exam Vitals reviewed.  Constitutional:      General: She is not in acute distress.    Appearance: She is not toxic-appearing.  HENT:     Mouth/Throat:     Mouth: Mucous membranes are moist.  Cardiovascular:     Rate and Rhythm: Normal rate and regular rhythm.  Abdominal:     Palpations: Abdomen is soft.  Skin:    Coloration: Skin is not pale.  Neurological:     General: No focal deficit present.     Mental Status: She is alert and oriented to person, place, and time.  Psychiatric:        Behavior: Behavior normal.      UC Treatments / Results  Labs (all labs ordered are listed, but only abnormal results are displayed) Labs Reviewed  POCT URINE DIPSTICK - Abnormal; Notable for the following components:      Result Value   Color, UA orange (*)    Glucose, UA =100 (*)    Ketones, POC UA trace (5) (*)    Spec Grav, UA <=1.005 (*)    Blood, UA trace-intact (*)    POC PROTEIN,UA =100 (*)    Urobilinogen, UA 4.0 (*)    Nitrite, UA Positive (*)    Leukocytes, UA Large (3+) (*)    All other components within normal limits  URINE CULTURE    EKG   Radiology No results found.  Procedures Procedures (including critical care time)  Medications Ordered in UC Medications - No data to display  Initial Impression / Assessment and Plan / UC Course  I have reviewed the triage vital signs and the nursing notes.  Pertinent labs &  imaging results that were available during my care of the patient were reviewed by me and considered in my medical decision making (see chart for details).     UA shows white cells and nitrites, consistent with UTI  Urine culture is sent  Keflex sent in to treat the cystitis and she already has AZO at home  Staff will notify her if it looks that the antibiotic needs to be changed.  Of note at discharge I realized I had put the wrong smart phrase in her discharge instructions: She actually has been sent cephalexin 2 and 50 mg 3 times daily for 7 days.  I discussed this with her at discharge Final Clinical Impressions(s) / UC Diagnoses   Final diagnoses:  Cystitis     Discharge Instructions      The urinalysis had white blood cells and nitrites, consistent with a bladder infection.  Cephalexin 500 mg --1 tablet by mouth 2 times daily for 7 days.  You can continue the AZO you have been taking  Drink plenty of fluids  Urine culture is sent, and staff will notify you that it looks like the antibiotic needs to be changed.     ED Prescriptions     Medication Sig Dispense Auth. Provider   cephALEXin (KEFLEX) 250 MG capsule Take 1 capsule (250 mg total) by mouth 3 (three) times daily for 7 days. 21 capsule Vonna, Yves Fodor K, MD      PDMP  not reviewed this encounter.   Vonna Sharlet POUR, MD 10/02/24 [phone]

## 2024-10-02 NOTE — ED Triage Notes (Signed)
 Pt reports urinary frequency and dysuria with burning sensation x5 days. Denies abdominal pain, N/V, hematuria, and flank pain. Has taken AZO at home with no relief.

## 2024-10-03 ENCOUNTER — Ambulatory Visit (HOSPITAL_COMMUNITY): Payer: Self-pay

## 2024-10-03 LAB — URINE CULTURE: Culture: NO GROWTH

## 2024-11-14 ENCOUNTER — Ambulatory Visit: Admission: EM | Admit: 2024-11-14 | Discharge: 2024-11-14 | Disposition: A | Discharge status: Home / Self Care

## 2024-11-14 ENCOUNTER — Ambulatory Visit (INDEPENDENT_AMBULATORY_CARE_PROVIDER_SITE_OTHER)

## 2024-11-14 ENCOUNTER — Encounter: Payer: Self-pay | Admitting: Emergency Medicine

## 2024-11-14 DIAGNOSIS — S96912A Strain of unspecified muscle and tendon at ankle and foot level, left foot, initial encounter: Secondary | ICD-10-CM

## 2024-11-14 MED ORDER — ACETAMINOPHEN 325 MG PO TABS
975.0000 mg | ORAL_TABLET | Freq: Once | ORAL | Status: AC
  Administered 2024-11-14: 975 mg via ORAL

## 2024-11-14 MED ORDER — IBUPROFEN 400 MG PO TABS
400.0000 mg | ORAL_TABLET | Freq: Once | ORAL | Status: AC
  Administered 2024-11-14: 400 mg via ORAL

## 2024-11-14 MED ORDER — DICLOFENAC SODIUM 75 MG PO TBEC: 75.0000 mg | DELAYED_RELEASE_TABLET | Freq: Two times a day (BID) | ORAL | 0 refills | Status: AC

## 2024-11-14 NOTE — ED Triage Notes (Signed)
 Pt presents c/o L foot pain x 7 days. Pt states,  I don't know what happened twith this foot. A Ravert over a week ago I was feeling some ankle pain, on and off. That went away. Yesterday, before I left work my foot started hurting. At 3 this morning the pain was worse. I couldn't put pressure on it or anything. It hurts to touch.  Pt denies fall and/or injury.   Pt reports pain is on top of L foot and in L ankle.

## 2024-11-14 NOTE — Discharge Instructions (Signed)
 Today you have been diagnosed with a musculoskeletal injury.  Adults may use 400 mg of ibuprofen  and 1000 mg of Tylenol  together every 8 hours as needed for pain control.  Children may take ibuprofen  and Tylenol  as directed on medication packaging.  You should use ice on affected area for 20 minutes at a time a couple times a day for the first 24 hours then you may switch to heat in the same intervals.  Be sure to put a barrier between ice or heat source and skin to prevent burns.  May also wrap affected area and Ace bandage if tolerated and appropriate, and elevate above the level of the heart to help reduce swelling.  Do not wrap Ace bandages around neck or torso as wrapping too tight can restrict air movement inability to breathe.  If symptoms do not seem to be improving in 3 to 5 days after following these instructions we need to follow-up with orthopedist or PCP.

## 2024-11-14 NOTE — ED Provider Notes (Signed)
 [name]    CSN: [id number] Arrival date & time: 11/14/24  0824      History   Chief Complaint Chief Complaint  Patient presents with   Foot Pain    L foot     HPI Annette Garrett is a 48 y.o. female.   Pt presents today due to 7 days worth of 7-8/10 left ankle pain.  Patient states that she does a lot of standing for her job and denies known injury.  Patient denies use of medication for her pain.  Patient is unable to bear weight on left lower extremity denies due to pain.  The history is provided by the patient.  Foot Pain    Past Medical History:  Diagnosis Date   Asthma    Intracranial hypertension     Patient Active Problem List   Diagnosis Date Noted   Dysphagia 10/02/2024   Epigastric pain 10/02/2024   Tonsil asymmetry 09/20/2016   Tonsil stone 09/20/2016   OSA (obstructive sleep apnea) 02/21/2016   Elevated intracranial pressure 05/06/2015   Empty sella 03/03/2015   Mild intermittent asthma without complication 03/03/2015   Other headache syndrome 03/03/2015   Gastro-esophageal reflux disease without esophagitis 08/21/2008   CHEST PAIN 08/21/2008    Past Surgical History:  Procedure Laterality Date   ABDOMINAL HYSTERECTOMY     TUBAL LIGATION      OB History     Gravida  5   Para  3   Term      Preterm      AB  2   Living  3      SAB  2   IAB      Ectopic      Multiple      Live Births               Home Medications    Prior to Admission medications   Medication Sig Start Date End Date Taking? Authorizing Provider  diclofenac (VOLTAREN) 75 MG EC tablet Take 1 tablet (75 mg total) by mouth 2 (two) times daily. 11/14/24  Yes Andra Krabbe C, PA-C  acetaminophen  (TYLENOL ) 500 MG tablet Take 1,000 mg by mouth every 6 (six) hours as needed for mild pain or headache.     [provider]  albuterol  (PROAIR  HFA) 108 (90 BASE) MCG/ACT inhaler Inhale 2 puffs into the lungs every 6 (six) hours as  needed. For wheezing and shortness of breath.    [provider]  albuterol  (VENTOLIN  HFA) 108 (90 Base) MCG/ACT inhaler Inhale into the lungs.    [provider]  albuterol  (VENTOLIN  HFA) 108 (90 Base) MCG/ACT inhaler Inhale into the lungs. 02/11/19   [provider]  [facility] HFA 100 MCG/ACT AERO SMARTSIG:2 Puff(s) By Mouth Twice Daily 09/20/24   [provider]  butalbital-acetaminophen -caffeine (FIORICET) 50-325-40 MG tablet Take 1 tablet by mouth every 6 (six) hours as needed. 01/27/22   [provider]  cetirizine (ZYRTEC) 10 MG tablet Take 10 mg by mouth daily.    [provider]  cetirizine (ZYRTEC) 10 MG tablet Take by mouth. 02/11/19   [provider]  Cholecalciferol 1.25 MG (50000 UT) capsule Take by mouth. Patient not taking: Reported on 10/02/2024 01/31/22   [provider]  cyclobenzaprine  (FLEXERIL ) 10 MG tablet Take 1 tablet (10 mg total) by mouth 3 (three) times daily as needed for muscle spasms. 08/21/20   Hall-Potvin, Brittany, PA-C  diazepam (VALIUM) 5 MG tablet  [provider]  EPINEPHrine  (EPIPEN  2-PAK) 0.3 mg/0.3 mL IJ SOAJ injection Inject 0.3 mg into the muscle once as needed (allergic reaction.). Patient not taking: Reported on 10/02/2024    [provider]  EPINEPHrine  0.3 mg/0.3 mL IJ SOAJ injection Inject into the muscle. Patient not taking: Reported on 10/02/2024    [provider]  fluticasone -salmeterol (ADVAIR HFA) 115-21 MCG/ACT inhaler Inhale 2 puffs into the lungs 2 (two) times daily for 7 days. Patient not taking: Reported on 10/02/2024 12/16/22 12/23/22  Mayer, Jodi R, NP  furosemide (LASIX) 40 MG tablet Take 40 mg by mouth daily.    [provider]  hydrocortisone  (ANUCORT-HC ) 25 MG suppository  01/28/21   [provider]  metroNIDAZOLE  (METROGEL ) 0.75 % vaginal gel PLACE 1 APPLICATORFUL VAGINALLY AT BEDTIME FOR 5 DAYS Patient not taking: Reported on  10/02/2024    [provider]  montelukast (SINGULAIR) 10 MG tablet Take 10 mg by mouth daily. 09/19/24   [provider]  NITROFURANTOIN  PO Take by mouth. Patient not taking: Reported on 10/02/2024    [provider]  nortriptyline (PAMELOR) 10 MG capsule Take by mouth. Patient not taking: Reported on 10/02/2024 09/08/22   [provider]  pantoprazole  (PROTONIX ) 20 MG tablet Take 1 tablet (20 mg total) by mouth 2 (two) times daily. Patient taking differently: Take 20 mg by mouth daily as needed for heartburn. 09/20/14   Loetta Senior, MD  pantoprazole  (PROTONIX ) 20 MG tablet Take by mouth. 09/10/17   [provider]  predniSONE  (DELTASONE ) 10 MG tablet Take 4 tabs daily for 2 days, then 3 tabs daily for 2 days, then 2 tabs daily for 2 days, then 1 tab daily for 2 days Patient not taking: Reported on 10/02/2024 03/24/22   [provider]  WEGOVY 0.25 MG/0.5ML SOAJ SQ injection SMARTSIG:0.25 Milligram(s) SUB-Q Once a Week 09/19/24   [provider]    Family History Family History  Problem Relation Age of Onset   Hypertension Mother    Diabetes Father    Diabetes Maternal Grandmother    Hypertension Maternal Grandmother     Social History Social History   Tobacco Use   Smoking status: Never    Passive exposure: Never   Smokeless tobacco: Never  Vaping Use   Vaping status: Never Used  Substance Use Topics   Alcohol use: Yes    Comment: social   Drug use: No     Allergies   Other, Oxycodone-acetaminophen , Codeine, and Oxycodone-acetaminophen    Review of Systems Review of Systems   Physical Exam Triage Vital Signs ED Triage Vitals  Encounter Vitals Group     BP 11/14/24 0853 134/75     Girls Systolic BP Percentile --      Girls Diastolic BP Percentile --      Boys Systolic BP Percentile --      Boys Diastolic BP Percentile --      Pulse Rate 11/14/24 0853 92     Resp 11/14/24 0853 18     Temp 11/14/24  0853 98.7 F (37.1 C)     Temp Source 11/14/24 0853 Oral     SpO2 11/14/24 0853 97 %     Weight 11/14/24 0853 213 lb (96.6 kg)     Height --      Head Circumference --      Peak Flow --      Pain Score 11/14/24 0851 8     Pain Loc --  Pain Education --      Exclude from Growth Chart --    No data found.  Updated Vital Signs BP 134/75 (BP Location: Left Arm)   Pulse 92   Temp 98.7 F (37.1 C) (Oral)   Resp 18   Wt 213 lb (96.6 kg)   SpO2 97%   Visual Acuity Right Eye Distance:   Left Eye Distance:   Bilateral Distance:    Right Eye Near:   Left Eye Near:    Bilateral Near:     Physical Exam Vitals and nursing note reviewed.  Constitutional:      General: She is not in acute distress.    Appearance: Normal appearance. She is not ill-appearing, toxic-appearing or diaphoretic.  Eyes:     General: No scleral icterus. Cardiovascular:     Rate and Rhythm: Normal rate and regular rhythm.     Heart sounds: Normal heart sounds.  Pulmonary:     Effort: Pulmonary effort is normal. No respiratory distress.     Breath sounds: Normal breath sounds. No wheezing or rhonchi.  Musculoskeletal:     Left ankle: Swelling present. No deformity, ecchymosis or lacerations. Tenderness present over the lateral malleolus. Decreased range of motion.  Skin:    General: Skin is warm.  Neurological:     Mental Status: She is alert and oriented to person, place, and time.  Psychiatric:        Mood and Affect: Mood normal.        Behavior: Behavior normal.      UC Treatments / Results  Labs (all labs ordered are listed, but only abnormal results are displayed) Labs Reviewed - No data to display  EKG   Radiology No results found.  Procedures Procedures (including critical care time)  Medications Ordered in UC Medications  acetaminophen  (TYLENOL ) tablet 975 mg (975 mg Oral Given 11/14/24 0921)  ibuprofen  (ADVIL ) tablet 400 mg (400 mg Oral Given 11/14/24 0920)    Initial  Impression / Assessment and Plan / UC Course  I have reviewed the triage vital signs and the nursing notes.  Pertinent labs & imaging results that were available during my care of the patient were reviewed by me and considered in my medical decision making (see chart for details).   Pt experienced some relief with in office medications Final Clinical Impressions(s) / UC Diagnoses   Final diagnoses:  Strain of left ankle, initial encounter     Discharge Instructions      Today you have been diagnosed with a musculoskeletal injury.  Adults may use 400 mg of ibuprofen  and 1000 mg of Tylenol  together every 8 hours as needed for pain control.  Children may take ibuprofen  and Tylenol  as directed on medication packaging.  You should use ice on affected area for 20 minutes at a time a couple times a day for the first 24 hours then you may switch to heat in the same intervals.  Be sure to put a barrier between ice or heat source and skin to prevent burns.  May also wrap affected area and Ace bandage if tolerated and appropriate, and elevate above the level of the heart to help reduce swelling.  Do not wrap Ace bandages around neck or torso as wrapping too tight can restrict air movement inability to breathe.  If symptoms do not seem to be improving in 3 to 5 days after following these instructions we need to follow-up with orthopedist or PCP.      ED  Prescriptions     Medication Sig Dispense Auth. Provider   diclofenac (VOLTAREN) 75 MG EC tablet Take 1 tablet (75 mg total) by mouth 2 (two) times daily. 30 tablet Andra Corean BROCKS, PA-C      PDMP not reviewed this encounter.   Andra Corean BROCKS, PA-C 11/14/24 1041

## 2024-11-15 ENCOUNTER — Ambulatory Visit (HOSPITAL_COMMUNITY): Payer: Self-pay

## 2024-11-19 ENCOUNTER — Ambulatory Visit: Admitting: Podiatry

## 2024-11-19 ENCOUNTER — Encounter: Payer: Self-pay | Admitting: Podiatry

## 2024-11-19 ENCOUNTER — Ambulatory Visit

## 2024-11-19 DIAGNOSIS — M7752 Other enthesopathy of left foot: Secondary | ICD-10-CM

## 2024-11-19 DIAGNOSIS — M25572 Pain in left ankle and joints of left foot: Secondary | ICD-10-CM

## 2024-11-19 DIAGNOSIS — M79671 Pain in right foot: Secondary | ICD-10-CM | POA: Diagnosis not present

## 2024-11-19 DIAGNOSIS — M778 Other enthesopathies, not elsewhere classified: Secondary | ICD-10-CM

## 2024-11-19 MED ORDER — PREDNISONE 5 MG PO TABS: ORAL_TABLET | ORAL | 1 refills | Status: DC

## 2024-11-19 NOTE — Progress Notes (Signed)
 Patient presents complaining of pain on the left foot over the dorsal central aspect of the foot.  Indicates pain mostly over the area of the cuboid cuneiform articulation.  Has not noticed any redness or swelling.  Does recall any specific injury to it started bothering her several weeks ago.  She has got an ankle brace and also is taking oral diclofenac    Physical exam:  General appearance: Pleasant, and in no acute distress. AOx3.  Vascular: Pedal pulses: DP 2/4 bilaterally, PT 2/4 bilaterally.  Mild edema lower legs bilaterally. Capillary fill time immediate bilaterally.  Neurological: Light touch intact feet bilaterally.  Normal Achilles reflex bilaterally.  No clonus or spasticity noted.   Dermatologic:   Skin normal temperature bilaterally.  Skin normal color, tone, and texture bilaterally.   Musculoskeletal: Tenderness at the cuneiform cuboid joint left.  No tenderness in the sinus tarsi range of motion subtalar joint no tenderness at the fourth fifth met cuboid joint.  Normal range of motion ankle joint and subtalar joint left.  Radiographs: 3 views foot left: Notes any fractures or dislocations.  Normal joint space at the cuneiform cuboid joint.  Notes any anterior process calcaneus fractures.  Diagnosis: 1.  Pain left foot. 2.  Arthralgia cuboid cuneiform joint left  Plan: -New patient office visit for evaluation and management level 3. - Physical the pain in the foot most likely pain is coming from the cuneiform cuboid joint.  Will try some prednisone  oral prednisone  and see how she responds to this and then see her back in a week if it still painful we can try an injection at that point.  Will also try OTC orthotics.  Wear the ankle brace she was given.  Wear good stable supportive shoes. - Rx prednisone  5 mg, 30 mg p.o. daily first day, then decrease by 5 mg every other day for 12 days. -Dispensed OTC orthotics foot bilaterally  Return 1 week follow-up possible  injection left

## 2024-11-26 ENCOUNTER — Ambulatory Visit: Admitting: Podiatry

## 2024-11-26 DIAGNOSIS — M7672 Peroneal tendinitis, left leg: Secondary | ICD-10-CM | POA: Diagnosis not present

## 2024-11-26 DIAGNOSIS — M25572 Pain in left ankle and joints of left foot: Secondary | ICD-10-CM

## 2024-11-26 MED ORDER — NAPROXEN 500 MG PO TABS: 500.0000 mg | ORAL_TABLET | Freq: Two times a day (BID) | ORAL | 1 refills | Status: AC

## 2024-11-26 NOTE — Progress Notes (Signed)
 Patient presents for follow-up of pain at the cuneiform cuboid joint left.  Doing much better this by about 70% better although still painful has noticed in the past few days pain along the lateral aspect of the ankle indicating at the tip of and behind the distal fibula.  Physical exam:  General appearance: Pleasant, and in no acute distress. AOx3.  Vascular: Pedal pulses: DP 2/4 bilaterally, PT 2/4 bilaterally. Mild edema lower legs bilaterally. Capillary fill time immediate bilateral.  Neurological: Grossly intact bilaterally  Dermatologic:   Skin normal temperature bilaterally.  Skin normal color, tone, and texture bilaterally.   Musculoskeletal: Tenderness along the peroneal tendons at the distal tip of the lateral malleolus and retromalleolar area left..  Some tenderness at the cuneiform cuboid joint left but not nearly as tender as before.  No tenderness in sinus tarsi.  Hallux valgus deformity left.   Diagnosis: 1.  Arthralgia cuneiform cuboid joint to left 2.  Peroneal tendinitis left  Plan: -Established office visit for evaluation and management.  Level 3. - Surgical pain in the foot she is doing significantly better this Tofte bit of peroneal pain today.  Still having pain at the cuneiform navicular joint but not nearly as severe as it was before.  Will have her try naproxen  500 mg p.o. twice daily for the next 2 or 3 weeks and see if this resolves the rest of the pain.  We will see her back in 4 weeks if not improved we will try an injection at that time. -Rx naproxen  500 mg 1 p.o. twice daily, refill x 1  Return 4 weeks follow-up

## 2024-12-24 ENCOUNTER — Ambulatory Visit: Admitting: Podiatry

## 2024-12-24 DIAGNOSIS — M25572 Pain in left ankle and joints of left foot: Secondary | ICD-10-CM

## 2024-12-24 MED ORDER — TRIAMCINOLONE ACETONIDE 10 MG/ML IJ SUSP
10.0000 mg | Freq: Once | INTRAMUSCULAR | Status: AC
  Administered 2024-12-24: 10 mg

## 2024-12-24 NOTE — Progress Notes (Signed)
 Still complaining of pain a Hitchman bit better but still sore.  Pain with walking and standing.   Physical exam:  General appearance: Pleasant, and in no acute distress. AOx3.  Vascular: Pedal pulses: DP 2/4 bilaterally, PT 2/4 bilaterally. Mild edema lower legs bilaterally. Capillary fill time immediate bilaterally.  Neurological: Grossly intact bilaterally  Dermatologic:   Skin normal temperature bilaterally.  Skin normal color, tone, and texture bilaterally.   Musculoskeletal: Tenderness over the calcaneocuboid joint left.  No tenderness at sinus tarsi or talonavicular joint.  No tenderness at TMT 4 and 5   Diagnosis: 1.  Arthralgia calcaneocuboid joint left  Plan: -Continue wearing supportive shoes.  Continue naproxen .  Ice as needed -injected 3cc 2:1 mixture 0.5 cc Marcaine: Triamcinolone  40mg /84ml at calcaneal cuboid joint left.     Return 2 weeks follow-up injection calcaneocuboid joint left

## 2025-01-07 ENCOUNTER — Ambulatory Visit: Admitting: Podiatry
